# Patient Record
Sex: Male | Born: 1970 | Race: Black or African American | Hispanic: No | Marital: Single | Smoking: Current every day smoker
Health system: Southern US, Community
[De-identification: ages and names within clinical notes are randomized; demographics above are authoritative.]

## PROBLEM LIST (undated history)

## (undated) DIAGNOSIS — D649 Anemia, unspecified: Secondary | ICD-10-CM

## (undated) DIAGNOSIS — Z720 Tobacco use: Secondary | ICD-10-CM

## (undated) DIAGNOSIS — I10 Essential (primary) hypertension: Secondary | ICD-10-CM

## (undated) DIAGNOSIS — E7800 Pure hypercholesterolemia, unspecified: Secondary | ICD-10-CM

## (undated) HISTORY — DX: Pure hypercholesterolemia, unspecified: E78.00

## (undated) HISTORY — DX: Tobacco use: Z72.0

## (undated) HISTORY — DX: Anemia, unspecified: D64.9

## (undated) HISTORY — PX: NO PAST SURGERIES: SHX2092

## (undated) HISTORY — DX: Essential (primary) hypertension: I10

---

## 2000-07-13 ENCOUNTER — Emergency Department (HOSPITAL_COMMUNITY): Admission: EM | Admit: 2000-07-13 | Discharge: 2000-07-13 | Payer: Self-pay | Admitting: Emergency Medicine

## 2001-01-25 ENCOUNTER — Emergency Department (HOSPITAL_COMMUNITY): Admission: EM | Admit: 2001-01-25 | Discharge: 2001-01-25 | Payer: Self-pay | Admitting: Emergency Medicine

## 2005-10-14 ENCOUNTER — Emergency Department (HOSPITAL_COMMUNITY): Admission: EM | Admit: 2005-10-14 | Discharge: 2005-10-14 | Payer: Self-pay | Admitting: Emergency Medicine

## 2006-05-25 ENCOUNTER — Emergency Department (HOSPITAL_COMMUNITY): Admission: EM | Admit: 2006-05-25 | Discharge: 2006-05-25 | Payer: Self-pay | Admitting: Emergency Medicine

## 2006-07-11 ENCOUNTER — Emergency Department (HOSPITAL_COMMUNITY): Admission: EM | Admit: 2006-07-11 | Discharge: 2006-07-11 | Payer: Self-pay | Admitting: Emergency Medicine

## 2010-04-28 ENCOUNTER — Emergency Department (HOSPITAL_COMMUNITY): Admission: EM | Admit: 2010-04-28 | Discharge: 2010-04-29 | Payer: Self-pay | Admitting: Emergency Medicine

## 2010-05-07 ENCOUNTER — Emergency Department (HOSPITAL_COMMUNITY): Admission: EM | Admit: 2010-05-07 | Discharge: 2010-05-08 | Payer: Self-pay | Admitting: Emergency Medicine

## 2010-05-08 ENCOUNTER — Encounter (INDEPENDENT_AMBULATORY_CARE_PROVIDER_SITE_OTHER): Payer: Self-pay | Admitting: *Deleted

## 2010-05-13 ENCOUNTER — Emergency Department (HOSPITAL_COMMUNITY): Admission: EM | Admit: 2010-05-13 | Discharge: 2010-05-13 | Payer: Self-pay | Admitting: Emergency Medicine

## 2010-06-04 ENCOUNTER — Emergency Department (HOSPITAL_COMMUNITY): Admission: EM | Admit: 2010-06-04 | Discharge: 2010-06-05 | Payer: Self-pay | Admitting: Emergency Medicine

## 2010-06-07 ENCOUNTER — Encounter (INDEPENDENT_AMBULATORY_CARE_PROVIDER_SITE_OTHER): Payer: Self-pay | Admitting: *Deleted

## 2011-03-12 ENCOUNTER — Other Ambulatory Visit: Payer: Self-pay | Admitting: Internal Medicine

## 2011-03-12 ENCOUNTER — Encounter: Payer: Self-pay | Admitting: Internal Medicine

## 2011-03-12 ENCOUNTER — Ambulatory Visit (INDEPENDENT_AMBULATORY_CARE_PROVIDER_SITE_OTHER): Payer: Managed Care, Other (non HMO) | Admitting: Internal Medicine

## 2011-03-12 ENCOUNTER — Encounter (INDEPENDENT_AMBULATORY_CARE_PROVIDER_SITE_OTHER): Payer: Self-pay | Admitting: *Deleted

## 2011-03-12 ENCOUNTER — Other Ambulatory Visit: Payer: Managed Care, Other (non HMO)

## 2011-03-12 DIAGNOSIS — Z0000 Encounter for general adult medical examination without abnormal findings: Secondary | ICD-10-CM

## 2011-03-12 DIAGNOSIS — D649 Anemia, unspecified: Secondary | ICD-10-CM | POA: Insufficient documentation

## 2011-03-12 DIAGNOSIS — G4730 Sleep apnea, unspecified: Secondary | ICD-10-CM

## 2011-03-12 DIAGNOSIS — K921 Melena: Secondary | ICD-10-CM

## 2011-03-12 DIAGNOSIS — E785 Hyperlipidemia, unspecified: Secondary | ICD-10-CM

## 2011-03-12 DIAGNOSIS — F17200 Nicotine dependence, unspecified, uncomplicated: Secondary | ICD-10-CM | POA: Insufficient documentation

## 2011-03-12 LAB — URINALYSIS, ROUTINE W REFLEX MICROSCOPIC
Bilirubin Urine: NEGATIVE
Ketones, ur: NEGATIVE
Specific Gravity, Urine: >=1.03 (ref 1.000–1.030)

## 2011-03-12 LAB — LIPID PANEL
Cholesterol: 227 mg/dL — ABNORMAL HIGH (ref 0–200)
HDL: 66.2 mg/dL (ref >39.00)
Total CHOL/HDL Ratio: 3
Triglycerides: 225 mg/dL — ABNORMAL HIGH (ref 0.0–149.0)

## 2011-03-12 LAB — CBC WITH DIFFERENTIAL/PLATELET
Eosinophils Relative: 1.9 % (ref 0.0–5.0)
HCT: 37.2 % — ABNORMAL LOW (ref 39.0–52.0)
Hemoglobin: 12.6 g/dL — ABNORMAL LOW (ref 13.0–17.0)
Lymphocytes Relative: 30.2 % (ref 12.0–46.0)
Neutro Abs: 4.9 10*3/uL (ref 1.4–7.7)
Platelets: 234 10*3/uL (ref 150.0–400.0)

## 2011-03-12 LAB — IBC PANEL: Saturation Ratios: 11.3 % — ABNORMAL LOW (ref 20.0–50.0)

## 2011-03-12 LAB — PSA: PSA: 0.94 ng/mL (ref 0.10–4.00)

## 2011-03-12 LAB — BASIC METABOLIC PANEL
Calcium: 9.8 mg/dL (ref 8.4–10.5)
Chloride: 101 mEq/L (ref 96–112)
Glucose, Bld: 101 mg/dL — ABNORMAL HIGH (ref 70–99)
Potassium: 4 mEq/L (ref 3.5–5.1)
Sodium: 136 mEq/L (ref 135–145)

## 2011-03-12 LAB — HEPATIC FUNCTION PANEL
AST: 43 U/L — ABNORMAL HIGH (ref 0–37)
Bilirubin, Direct: 0.1 mg/dL (ref 0.0–0.3)
Total Bilirubin: 0.3 mg/dL (ref 0.3–1.2)
Total Protein: 7.7 g/dL (ref 6.0–8.3)

## 2011-03-12 LAB — LDL CHOLESTEROL, DIRECT: Direct LDL: 91 mg/dL

## 2011-03-12 LAB — TSH: TSH: 0.69 u[IU]/mL (ref 0.35–5.50)

## 2011-03-18 LAB — CBC
HCT: 38.8 % — ABNORMAL LOW (ref 39.0–52.0)
Platelets: 265 10*3/uL (ref 150–400)
RBC: 4.53 MIL/uL (ref 4.22–5.81)

## 2011-03-18 NOTE — Letter (Signed)
Summary: Results Follow-up Letter  [facility]  [address] [place], [place] [address]   Phone: [phone]  Fax: [phone]    03/12/2011  [address] [name], [place]  [address]  [place]  Dear Mr. Darley,   The following are the results of your recent test(s):  Test     Result     B12/iron     normal Thyroid     normal CBC       mild anemia Liver       one, slight enzyme elevation Kidney     normal Urine       some whote blood cells - ? prostate infection Prostate cancer   normal _________________________________________________________  Please call for an appointment in 1-2 weeks _________________________________________________________ _________________________________________________________ _________________________________________________________  Sincerely,  Sanda Linger MD [facility]

## 2011-03-18 NOTE — Letter (Signed)
Summary: New Patient letter  [facility]  [address] [place], [place] [address]   Phone: [phone]  Fax: [phone]       03/12/2011 MRN: [id number]  [place] [address] [place], [place]  [address]  [place]  Dear Mr. Stegenga,  Welcome to the Gastroenterology Division at [facility].    You are scheduled to see Dr.  Russella Dar on 04-22-11 at 3:30P.M. on the 3rd floor at [facility], [address]  We ask that you try to arrive at our office 15 minutes prior to your appointment time to allow for check-in.  We would like you to complete the enclosed self-administered evaluation form prior to your visit and bring it with you on the day of your appointment.  We will review it with you.  Also, please bring a complete list of all your medications or, if you prefer, bring the medication bottles and we will list them.  Please bring your insurance card so that we may make a copy of it.  If your insurance requires a referral to see a specialist, please bring your referral form from your primary care physician.  Co-payments are due at the time of your visit and may be paid by cash, check or credit card.     Your office visit will consist of a consult with your physician (includes a physical exam), any laboratory testing he/she may order, scheduling of any necessary diagnostic testing (e.g. x-ray, ultrasound, CT-scan), and scheduling of a procedure (e.g. Endoscopy, Colonoscopy) if required.  Please allow enough time on your schedule to allow for any/all of these possibilities.    If you cannot keep your appointment, please call [phone] to cancel or reschedule prior to your appointment date.  This allows [place] the opportunity to schedule an appointment for another patient in need of care.  If you do not cancel or reschedule by 5 p.m. the business day prior to your appointment date, you will be charged a $50.00 late cancellation/no-show fee.    Thank you  for choosing [facility] for your medical needs.  We appreciate the opportunity to care for you.  Please visit [place] at our website  to learn more about our practice.                     Sincerely,                                                             The Gastroenterology Division

## 2011-03-18 NOTE — Assessment & Plan Note (Signed)
Summary: NEW PT / [place]   Vital Signs:  Patient profile:   40 year old male Height:      71 inches Weight:      212 pounds BMI:     29.67 O2 Sat:      98 % on Room air Temp:     98.3 degrees F oral Pulse rate:   76 / minute Pulse rhythm:   regular Resp:     16 per minute BP sitting:   120 / 86  (left arm) Cuff size:   large  Vitals Entered By: Rock Nephew CMA (March 12, 2011 9:16 AM)  Nutrition Counseling: Patient's BMI is greater than 25 and therefore counseled on weight management options.  O2 Flow:  Room air  Primary Care Provider:  Yetta Barre   History of Present Illness: New to me he comes in for a complete physical today. He thinks he has sleep apnea.  He had an episode of blood in his stool in May 2011 and a CBC showed mild anemia, he was referred for a colonoscopy but he tells me that he did not get it done but he wants a new GI referral at this time.  Preventive Screening-Counseling & Management  Alcohol-Tobacco     Alcohol drinks/day: 1     Alcohol type: all     >5/day in last 3 mos: no     Alcohol Counseling: not indicated; use of alcohol is not excessive or problematic     Feels need to cut down: no     Feels annoyed by complaints: no     Feels guilty re: drinking: no     Needs 'eye opener' in am: no     Smoking Status: current     Smoking Cessation Counseling: yes     Packs/Day: 0.5     Year Started: 1987     Pack years: 20     Tobacco Counseling: to quit use of tobacco products  Caffeine-Diet-Exercise     Does Patient Exercise: yes  Hep-HIV-STD-Contraception     Hepatitis Risk: no risk noted     HIV Risk: no risk noted     STD Risk: no risk noted     Dental Visit-last 6 months yes     Dental Care Counseling: to seek dental care; no dental care within six months     TSE monthly: yes     Testicular SE Education/Counseling to perform regular STE     Sun Exposure-Excessive: no  Safety-Violence-Falls     Seat Belt Use: yes     Helmet Use:  n/a     Firearms in the Home: no firearms in the home     Smoke Detectors: yes     Violence in the Home: no risk noted      Sexual History:  currently monogamous.        Drug Use:  no.        Blood Transfusions:  no.    Current Medications (verified): 1)  None  Allergies (verified): No Known Drug Allergies  Past History:  Past Medical History: Anemia-NOS  Past Surgical History: Denies surgical history  Family History: Family History Hypertension  Social History: Occupation: Holiday representative for [employer] Single Current Smoker Alcohol use-yes Drug use-no Regular exercise-yes Smoking Status:  current Drug Use:  no Does Patient Exercise:  yes Packs/Day:  0.5 Hepatitis Risk:  no risk noted HIV Risk:  no risk noted STD Risk:  no risk noted Dental Care w/in 6  mos.:  yes Sun Exposure-Excessive:  no Seat Belt Use:  yes Sexual History:  currently monogamous Blood Transfusions:  no  Review of Systems       The patient complains of weight gain.  The patient denies anorexia, fever, weight loss, chest pain, syncope, dyspnea on exertion, peripheral edema, prolonged cough, headaches, hemoptysis, abdominal pain, melena, hematochezia, severe indigestion/heartburn, hematuria, incontinence, muscle weakness, suspicious skin lesions, transient blindness, difficulty walking, depression, enlarged lymph nodes, angioedema, and testicular masses.   Resp:  Complains of excessive snoring, hypersomnolence, and morning headaches; denies chest discomfort, chest pain with inspiration, cough, coughing up blood, pleuritic, shortness of breath, sputum productive, and wheezing. GI:  Denies abdominal pain, bloody stools, change in bowel habits, constipation, dark tarry stools, diarrhea, excessive appetite, gas, hemorrhoids, indigestion, loss of appetite, nausea, vomiting, vomiting blood, and yellowish skin color. Heme:  Denies abnormal bruising, bleeding, enlarge lymph nodes, fevers, pallor, and skin  discoloration.  Physical Exam  General:  alert, well-developed, well-nourished, well-hydrated, appropriate dress, normal appearance, healthy-appearing, cooperative to examination, and good hygiene.   Head:  normocephalic, atraumatic, no abnormalities observed, and no abnormalities palpated.   Eyes:  vision grossly intact, pupils equal, pupils round, and pupils reactive to light.   Ears:  R ear normal and L ear normal.   Mouth:  Oral mucosa and oropharynx without lesions or exudates.  Teeth in good repair. Neck:  No deformities, masses, or tenderness noted. Breasts:  No masses or gynecomastia noted Lungs:  Normal respiratory effort, chest expands symmetrically. Lungs are clear to auscultation, no crackles or wheezes. Heart:  Normal rate and regular rhythm. S1 and S2 normal without gallop, murmur, click, rub or other extra sounds. Abdomen:  soft, non-tender, normal bowel sounds, no distention, no masses, no guarding, no rigidity, no rebound tenderness, no abdominal hernia, no inguinal hernia, no hepatomegaly, and no splenomegaly.   Rectal:  No external abnormalities noted. Normal sphincter tone. No rectal masses or tenderness. Genitalia:  circumcised, no hydrocele, no varicocele, no scrotal masses, no testicular masses or atrophy, no cutaneous lesions, and no urethral discharge.   Prostate:  no nodules, no asymmetry, no induration, and 1+ enlarged.   Msk:  No deformity or scoliosis noted of thoracic or lumbar spine.   Pulses:  R and L carotid,radial,femoral,dorsalis pedis and posterior tibial pulses are full and equal bilaterally Extremities:  No clubbing, cyanosis, edema, or deformity noted with normal full range of motion of all joints.   Neurologic:  No cranial nerve deficits noted. Station and gait are normal. Plantar reflexes are down-going bilaterally. DTRs are symmetrical throughout. Sensory, motor and coordinative functions appear intact. Skin:  Intact without suspicious lesions or  rashes Cervical Nodes:  No lymphadenopathy noted Axillary Nodes:  No palpable lymphadenopathy Inguinal Nodes:  No significant adenopathy Psych:  Cognition and judgment appear intact. Alert and cooperative with normal attention span and concentration. No apparent delusions, illusions, hallucinations Additional Exam:  EKG shows some early repol but nothing abnormal   Impression & Recommendations:  Problem # 1:  ROUTINE GENERAL MEDICAL EXAM@HEALTH  CARE FACL (ICD-V70.0) Assessment New  Orders: TLB-B12 + Folate Pnl (16[address]_60454-U98/JXB) TLB-IBC Pnl (Iron/FE;Transferrin) (83550-IBC) TLB-Lipid Panel (80061-LIPID) TLB-BMP (Basic Metabolic Panel-BMET) (80048-METABOL) TLB-CBC Platelet - w/Differential (85025-CBCD) TLB-Hepatic/Liver Function Pnl (80076-HEPATIC) TLB-TSH (Thyroid Stimulating Hormone) (84443-TSH) TLB-PSA (Prostate Specific Antigen) (84153-PSA) TLB-Udip w/ Micro (81001-URINE) EKG w/ Interpretation (93000) Hemoccult Guaiac-1 spec.(in office) (82270)  Discussed using sunscreen, use of alcohol, drug use, self testicular exam, routine dental care, routine eye care, routine physical exam,  seat belts, multiple vitamins, osteoporosis prevention, adequate calcium intake in diet, and recommendations for immunizations.  Discussed exercise and checking cholesterol.  Discussed gun safety, safe sex, and contraception. Also recommend checking PSA.  Problem # 2:  BLOOD IN STOOL (ICD-578.1) Assessment: Improved  Orders: Gastroenterology Referral (GI) Hemoccult Guaiac-1 spec.(in office) (82270)  Problem # 3:  ANEMIA-NOS (ICD-285.9) Assessment: Unchanged  Orders: TLB-B12 + Folate Pnl (10272_53664-Q03/KVQ) TLB-IBC Pnl (Iron/FE;Transferrin) (83550-IBC) TLB-Lipid Panel (80061-LIPID) TLB-BMP (Basic Metabolic Panel-BMET) (80048-METABOL) TLB-CBC Platelet - w/Differential (85025-CBCD) TLB-Hepatic/Liver Function Pnl (80076-HEPATIC) TLB-TSH (Thyroid Stimulating Hormone) (84443-TSH) TLB-PSA  (Prostate Specific Antigen) (84153-PSA) TLB-Udip w/ Micro (81001-URINE) Hemoccult Guaiac-1 spec.(in office) (82270)  Problem # 4:  SLEEP APNEA (ICD-780.57) Assessment: New  Orders: Sleep Disorder Referral (Sleep Disorder) TLB-B12 + Folate Pnl (25956_38756-E33/IRJ) TLB-IBC Pnl (Iron/FE;Transferrin) (83550-IBC) TLB-Lipid Panel (80061-LIPID) TLB-BMP (Basic Metabolic Panel-BMET) (80048-METABOL) TLB-CBC Platelet - w/Differential (85025-CBCD) TLB-Hepatic/Liver Function Pnl (80076-HEPATIC) TLB-TSH (Thyroid Stimulating Hormone) (84443-TSH) TLB-PSA (Prostate Specific Antigen) (84153-PSA) TLB-Udip w/ Micro (81001-URINE) Tobacco use cessation intermediate 3-10 minutes (18841)  Colorectal Screening:  Current Recommendations:    Hemoccult: NEG X 1 today    Colonoscopy recommended: scheduled with G.I.  PSA Screening:    Reviewed PSA screening recommendations: PSA ordered  Patient Instructions: 1)  Please schedule a follow-up appointment in 2 weeks. 2)  Tobacco is very bad for your health and your loved ones! You Should stop smoking!. 3)  Stop Smoking Tips: Choose a Quit date. Cut down before the Quit date. decide what you will do as a substitute when you feel the urge to smoke(gum,toothpick,exercise). 4)  Schedule a colonoscopy/sigmoidoscopy to help detect colon cancer. 5)  If you could be exposed to sexually transmitted diseases, you should use a condom. 6)  Take an Aspirin every day.   Orders Added: 1)  Sleep Disorder Referral [Sleep Disorder] 2)  TLB-B12 + Folate Pnl [82746_82607-B12/FOL] 3)  TLB-IBC Pnl (Iron/FE;Transferrin) [83550-IBC] 4)  TLB-Lipid Panel [80061-LIPID] 5)  TLB-BMP (Basic Metabolic Panel-BMET) [80048-METABOL] 6)  TLB-CBC Platelet - w/Differential [85025-CBCD] 7)  TLB-Hepatic/Liver Function Pnl [80076-HEPATIC] 8)  TLB-TSH (Thyroid Stimulating Hormone) [84443-TSH] 9)  TLB-PSA (Prostate Specific Antigen) [84153-PSA] 10)  TLB-Udip w/ Micro [81001-URINE] 11)   Gastroenterology Referral [GI] 12)  EKG w/ Interpretation [93000] 13)  Tobacco use cessation intermediate 3-10 minutes [99406] 14)  Hemoccult Guaiac-1 spec.(in office) [82270] 15)  New Patient Level III [66063] 16)  New Patient 40-64 years [phone]

## 2011-03-18 NOTE — Letter (Signed)
Summary: Lipid Letter  [facility]  [address] [place], [place] [address]   Phone: [phone]  Fax: [phone]    03/12/2011  Campbell Riches [address] [place], [place]  [address]  Dear Molly Maduro:  We have carefully reviewed your last lipid profile from  and the results are noted below with a summary of recommendations for lipid management.    Cholesterol:       227     Goal: <200   HDL "good" Cholesterol:   25.42     Goal: >40   LDL "bad" Cholesterol:   91     Goal: <130   Triglycerides:       225.0     Goal: <150 too high        TLC Diet (Therapeutic Lifestyle Change): Saturated Fats & Transfatty acids should be kept < 7% of total calories ***Reduce Saturated Fats Polyunstaurated Fat can be up to 10% of total calories Monounsaturated Fat Fat can be up to 20% of total calories Total Fat should be no greater than 25-35% of total calories Carbohydrates should be 50-60% of total calories Protein should be approximately 15% of total calories Fiber should be at least 20-30 grams a day ***Increased fiber may help lower LDL Total Cholesterol should be < 200mg /day Consider adding plant stanol/sterols to diet (example: Benacol spread) ***A higher intake of unsaturated fat may reduce Triglycerides and Increase HDL    Adjunctive Measures (may lower LIPIDS and reduce risk of Heart Attack) include: Aerobic Exercise (20-30 minutes 3-4 times a week) Limit Alcohol Consumption Weight Reduction Aspirin 75-81 mg a day by mouth (if not allergic or contraindicated) Dietary Fiber 20-30 grams a day by mouth     Current Medications:  None If you have any questions, please call. We appreciate being able to work with you.   Sincerely,    [facility]

## 2011-03-26 ENCOUNTER — Encounter: Payer: Self-pay | Admitting: Internal Medicine

## 2011-03-26 ENCOUNTER — Ambulatory Visit (INDEPENDENT_AMBULATORY_CARE_PROVIDER_SITE_OTHER): Payer: Managed Care, Other (non HMO) | Admitting: Internal Medicine

## 2011-03-26 VITALS — BP 126/82 | HR 77 | Temp 98.6°F | Ht 71.0 in | Wt 210.0 lb

## 2011-03-26 DIAGNOSIS — Z720 Tobacco use: Secondary | ICD-10-CM

## 2011-03-26 DIAGNOSIS — E781 Pure hyperglyceridemia: Secondary | ICD-10-CM

## 2011-03-26 DIAGNOSIS — K921 Melena: Secondary | ICD-10-CM

## 2011-03-26 DIAGNOSIS — F17200 Nicotine dependence, unspecified, uncomplicated: Secondary | ICD-10-CM

## 2011-03-26 DIAGNOSIS — G4730 Sleep apnea, unspecified: Secondary | ICD-10-CM

## 2011-03-26 MED ORDER — FENOFIBRATE 150 MG PO CAPS: 1.0000 | ORAL_CAPSULE | Freq: Every day | ORAL | Status: DC

## 2011-03-26 NOTE — Assessment & Plan Note (Signed)
He tells me that his colonoscopy is scheduled very soon

## 2011-03-26 NOTE — Patient Instructions (Signed)
What is this medicine? FENOFIBRATE (fen oh FYE brate) can help lower blood fats and cholesterol for people who are at risk of getting inflammation of the pancreas (pancreatitis) from having very high amounts of fats in their blood. This medicine is only for patients whose blood fats are not controlled by diet. This medicine may be used for other purposes; ask your health care provider or pharmacist if you have questions.   What should I tell my health care provider before I take this medicine? They need to know if you have any of these conditions: -gallbladder disease -heart disease -kidney disease -liver disease -an unusual or allergic reaction to fenofibrate, gemfibrozil, other medicines, foods, dyes, or preservatives -pregnant or trying to get pregnant -breast-feeding   How should I use this medicine? Take this medicine by mouth with a glass of water. Follow the directions on the prescription label. Do not take chipped or broken tablets. Take your medicine at regular intervals. Do not take it more often than directed. Do not stop taking except on your doctor's advice.   Talk to your pediatrician regarding the use of this medicine in children. Special care may be needed.   Overdosage: If you think you have taken too much of this medicine contact a poison control center or emergency room at once. NOTE: This medicine is only for you. Do not share this medicine with others.   What if I miss a dose? If you miss a dose, take it as soon as you can. If it is almost time for your next dose, take only that dose. Do not take double or extra doses.   What may interact with this medicine? Do not take this medicine with any of the following medications: -ezetimibe -statin-type cholesterol lowering drugs like atorvastatin, cerivastatin, fluvastatin, lovastatin, pravastatin, or simvastatin -red yeast rice   This medicine may also interact with the following medications: -cholestyramine or  colestipol -cyclosporine -warfarin   This list may not describe all possible interactions. Give your health care provider a list of all the medicines, herbs, non-prescription drugs, or dietary supplements you use. Also tell them if you smoke, drink alcohol, or use illegal drugs. Some items may interact with your medicine.   What should I watch for while using this medicine? Visit your doctor or health care professional for regular checks on your progress. Your blood fats and other tests will be measured from time to time. Do not stop taking this medicine except on the advice of your doctor or health care professional.   This medicine is only part of a total cholesterol-lowering program. Your health care professional or dietician can suggest a low-cholesterol and low-fat diet that will reduce your risk of getting heart and blood vessel disease. Avoid alcohol and smoking, and keep a proper exercise schedule.   If you are diabetic, close regulation and monitoring of your blood sugars can help your blood fat levels. This medicine may change the way your diabetic medication works, and sometimes will require that your dosages be adjusted. Check with your doctor or health care professional.   Bonita Quin may get drowsy or dizzy. Do not drive, use machinery, or do anything that needs mental alertness until you know how this drug affects you. Do not stand or sit up quickly, especially if you are an older patient. This reduces the risk of dizzy or fainting spells.   This medicine can make you more sensitive to the sun. Keep out of the sun. If you cannot avoid being  in the sun, wear protective clothing and use sunscreen. Do not use sun lamps or tanning beds/booths.   What side effects may I notice from receiving this medicine? Side effects that you should report to your doctor or health care professional as soon as possible: -allergic reactions like skin rash, itching or hives, swelling of the face, lips, or  tongue -dark urine -lower back or side pain -muscle pain, tenderness, or weakness -skin-bruising -stomach pain -trouble passing urine or change in the amount of urine -unusually weak or tired -yellowing of the eyes or skin   Side effects that usually do not require medical attention (report to your doctor or health care professional if they continue or are bothersome): -constipation -headache -nausea   This list may not describe all possible side effects. Call your doctor for medical advice about side effects. You may report side effects to FDA at 1-800-FDA-1088.   Where should I keep my medicine? Keep out of the reach of children.   Store the tablets in the original container at room temperature between 15 and 30 degrees C (59 and 86 degrees F). Keep container tightly closed. Throw away any unused medicine after the expiration date.   NOTE:This sheet is a summary. It may not cover all possible information. If you have questions about this medicine, talk to your doctor, pharmacist, or health care provider.      2011, [employer].  Hypertriglyceridemia  Diet for High blood levels of Triglycerides Most fats in food are triglycerides. Triglycerides in your blood are stored as fat in your body. High levels of triglycerides in your blood may put you at a greater risk for heart disease and stroke.  Normal triglyceride levels are less than 150 mg/dL. Borderline high levels are 150-199 mg/dl. High levels are 200 - 499 mg/dL, and very high triglyceride levels are greater than 500 mg/dL. The decision to treat high triglycerides is generally based on the level. For people with borderline or high triglyceride levels, treatment includes weight loss and exercise. Drugs are recommended for people with very high triglyceride levels. Many people who need treatment for high triglyceride levels have metabolic syndrome. This syndrome is a collection of disorders that often include: insulin  resistance, high blood pressure, blood clotting problems, high cholesterol and triglycerides. TESTING PROCEDURE FOR TRIGLYCERIDES  You should not eat 4 hours before getting your triglycerides measured. The normal range of triglycerides is between 10 and 250 milligrams per deciliter (mg/dl). Some people may have extreme levels (1000 or above), but your triglyceride level may be too high if it is above 150 mg/dl, depending on what other risk factors you have for heart disease.   People with high blood triglycerides may also have high blood cholesterol levels. If you have high blood cholesterol as well as high blood triglycerides, your risk for heart disease is probably greater than if you only had high triglycerides. High blood cholesterol is one of the main risk factors for heart disease.  CHANGING YOUR DIET  Your weight can affect your blood triglyceride level. If you are more than 20% above your ideal body weight, you may be able to lower your blood triglycerides by losing weight. Eating less and exercising regularly is the best way to combat this. Fat provides more calories than any other food. The best way to lose weight is to eat less fat. Only 30% of your total calories should come from fat. Less than 7% of your diet should come from saturated fat. A diet  low in fat and saturated fat is the same as a diet to decrease blood cholesterol. By eating a diet lower in fat, you may lose weight, lower your blood cholesterol, and lower your blood triglyceride level.  Eating a diet low in fat, especially saturated fat, may also help you lower your blood triglyceride level. Ask your dietitian to help you figure how much fat you can eat based on the number of calories your caregiver has prescribed for you.  Exercise, in addition to helping with weight loss may also help lower triglyceride levels.   Alcohol can increase blood triglycerides. You may need to stop drinking alcoholic beverages.   Too much  carbohydrate in your diet may also increase your blood triglycerides. Some complex carbohydrates are necessary in your diet. These may include bread, rice, potatoes, other starchy vegetables and cereals.   Reduce "simple" carbohydrates. These may include pure sugars, candy, honey, and jelly without losing other nutrients. If you have the kind of high blood triglycerides that is affected by the amount of carbohydrates in your diet, you will need to eat less sugar and less high-sugar foods. Your caregiver can help you with this.   Adding 2-4 grams of fish oil (EPA+ DHA) may also help lower triglycerides. Speak with your caregiver before adding any supplements to your regimen.  Following the Diet  Maintain your ideal weight. Your caregivers can help you with a diet. Generally, eating less food and getting more exercise will help you lose weight. Joining a weight control group may also help. Ask your caregivers for a good weight control group in your area.  Eat low-fat foods instead of high-fat foods. This can help you lose weight too.  These foods are lower in fat. Eat MORE of these:   Dried beans, peas, and lentils.   Egg whites.   Low-fat cottage cheese.   Fish.   Lean cuts of meat, such as round, sirloin, rump, and flank (cut extra fat off meat you fix).   Whole grain breads, cereals and pasta.   Skim and nonfat dry milk.   Low-fat yogurt.   Poultry without the skin.   Cheese made with skim or part-skim milk, such as mozzarella, parmesan, farmers', ricotta, or pot cheese.   These are higher fat foods. Eat LESS of these:   Whole milk and foods made from whole milk, such as American, blue, cheddar, monterey jack, and swiss cheese   High-fat meats, such as luncheon meats, sausages, knockwurst, bratwurst, hot dogs, ribs, corned beef, ground pork, and regular ground beef.   Fried foods.  Limit saturated fats in your diet. Substituting unsaturated fat for saturated fat may decrease your  blood triglyceride level. You will need to read package labels to know which products contain saturated fats.  These foods are high in saturated fat. Eat LESS of these:   Fried pork skins.  Whole milk.   Skin and fat from poultry.   Palm oil.   Butter.   Shortening.   Cream cheese.   Tomasa Blase.   Margarines and baked goods made from listed oils.   Vegetable shortenings.   Chitterlings.  Fat from meats.   Coconut oil.   Palm kernel oil.   Lard.   Cream.   Sour cream.   Fatback.   Coffee whiteners and non-dairy creamers made with these oils.   Cheese made from whole milk.   Use unsaturated fats (both polyunsaturated and monounsaturated) moderately. Remember, even though unsaturated fats are better than saturated  fats; you still want a diet low in total fat.  These foods are high in unsaturated fat:   Canola oil.  Sunflower oil.   Mayonnaise.   Almonds.   Peanuts.   Pine nuts.   Margarines made with these oils.   Safflower oil.  Olive oil.   Avocados.   Cashews.   Peanut butter.   Sunflower seeds.   Soybean oil.  Peanut oil.   Olives.   Pecans.   Walnuts.   Pumpkin seeds.   Avoid sugar and other high-sugar foods. This will decrease carbohydrates without decreasing other nutrients. Sugar in your food goes rapidly to your blood. When there is excess sugar in your blood, your liver may use it to make more triglycerides. Sugar also contains calories without other important nutrients.  Eat LESS of these:   Sugar, brown sugar, powdered sugar, jam, jelly, preserves, honey, syrup, molasses, pies, candy, cakes, cookies, frosting, pastries, colas, soft drinks, punches, fruit drinks, and regular gelatin.   Avoid alcohol. Alcohol, even more than sugar, may increase blood triglycerides. In addition, alcohol is high in calories and low in nutrients. Ask for sparkling water, or a diet soft drink instead of an alcoholic beverage.  Suggestions for planning  and preparing meals   Bake, broil, grill or roast meats instead of frying.   Remove fat from meats and skin from poultry before cooking.   Add spices, herbs, lemon juice or vinegar to vegetables instead of salt, rich sauces or gravies.   Use a non-stick skillet without fat or use no-stick sprays.   Cool and refrigerate stews and broth. Then remove the hardened fat floating on the surface before serving.   Refrigerate meat drippings and skim off fat to make low-fat gravies.   Serve more fish.   Use less butter, margarine and other high-fat spreads on bread or vegetables.   Use skim or reconstituted non-fat dry milk for cooking.   Cook with low-fat cheeses.   Substitute low-fat yogurt or cottage cheese for all or part of the sour cream in recipes for sauces, dips or congealed salads.   Use half yogurt/half mayonnaise in salad recipes.   Substitute evaporated skim milk for cream. Evaporated skim milk or reconstituted non-fat dry milk can be whipped and substituted for whipped cream in certain recipes.   Choose fresh fruits for dessert instead of high-fat foods such as pies or cakes. Fruits are naturally low in fat.  When Dining Out   Order low-fat appetizers such as fruit or vegetable juice, pasta with vegetables or tomato sauce.   Select clear, rather than cream soups.   Ask that dressings and gravies be served on the side. Then use less of them.   Order foods that are baked, broiled, poached, steamed, stir-fried, or roasted.   Ask for margarine instead of butter, and use only a small amount.   Drink sparkling water, unsweetened tea or coffee, or diet soft drinks instead of alcohol or other sweet beverages.  QUESTIONS AND ANSWERS ABOUT OTHER FATS IN THE BLOOD:  SATURATED FAT, TRANS FAT, AND CHOLESTEROL What is trans fat? Trans fat is a type of fat that is formed when vegetable oil is hardened through a process called hydrogenation. This process helps makes foods more solid,  gives them shape, and prolongs their shelf life. Trans fats are also called hydrogenated or partially hydrogenated oils.  What do saturated fat, trans fat, and cholesterol in foods have to do with heart disease? Saturated fat, trans fat, and  cholesterol in the diet all raise the level of LDL "bad" cholesterol in the blood. The higher the LDL cholesterol, the greater the risk for coronary heart disease (CHD). Saturated fat and trans fat raise LDL similarly.  What foods contain saturated fat, trans fat, and cholesterol? High amounts of saturated fat are found in animal products, such as fatty cuts of meat, chicken skin, and full-fat dairy products like butter, whole milk, cream, and cheese, and in tropical vegetable oils such as palm, palm kernel, and coconut oil. Trans fat is found in some of the same foods as saturated fat, such as vegetable shortening, some margarines (especially hard or stick margarine), crackers, cookies, baked goods, fried foods, salad dressings, and other processed foods made with partially hydrogenated vegetable oils. Small amounts of trans fat also occur naturally in some animal products, such as milk products, beef, and lamb. Foods high in cholesterol include liver, other organ meats, egg yolks, shrimp, and full-fat dairy products. How can I use the new food label to make heart-healthy food choices? Check the Nutrition Facts panel of the food label. Choose foods lower in saturated fat, trans fat, and cholesterol. For saturated fat and cholesterol, you can also use the Percent Daily Value (%DV): 5% DV or less is low, and 20% DV or more is high. (There is no %DV for trans fat.) Use the Nutrition Facts panel to choose foods low in saturated fat and cholesterol, and if the trans fat is not listed, read the ingredients and limit products that list shortening or hydrogenated or partially hydrogenated vegetable oil, which tend to be high in trans fat. POINTS TO REMEMBER: YOU NEED A LITTLE  TLC (THERAPEUTIC LIFESTYLE CHANGES)  Discuss your risk for heart disease with your caregivers, and take steps to reduce risk factors.   Change your diet. Choose foods that are low in saturated fat, trans fat, and cholesterol.   Add exercise to your daily routine if it is not already being done. Participate in physical activity of moderate intensity, like brisk walking, for at least 30 minutes on most, and preferably all days of the week. No time? Break the 30 minutes into three, 10-minute segments during the day.   Stop smoking. If you do smoke, contact your caregiver to discuss ways in which they can help you quit.   Do not use street drugs.   Maintain a normal weight.   Maintain a healthy blood pressure.   Keep up with your blood work for checking the fats in your blood as directed by your caregiver.  Document Released: 10/02/2004 Document Re-Released: 06/04/2010 [facility] Patient Information 2011 [place], [place].

## 2011-03-26 NOTE — Assessment & Plan Note (Signed)
He tells me that he is doing a sleep study soon

## 2011-03-26 NOTE — Assessment & Plan Note (Signed)
I advised him of the need to quit smoking but he is not interested at this time, I will continue to inform him of the harms of tobacco abuse

## 2011-03-26 NOTE — Progress Notes (Signed)
Subjective:    Tony Robertson is a 40 y.o. male here for follow up of dyslipidemia. The patient does not use medications that may worsen dyslipidemias (corticosteroids, progestins, anabolic steroids, diuretics, beta-blockers, amiodarone, cyclosporine, olanzapine). The patient exercises infrequently. The patient is not known to have coexisting coronary artery disease.   Cardiac Risk Factors Age > 45-male, > 55-male:  YES  +1  Smoking:   YES  +1  Sig. family hx of CHD*:  YES  +1  Hypertension:   NO  Diabetes:   NO  HDL < 35:   NO  HDL > 59:   YES  -1  Total: 2   *- Sig. family h/o CHD per NCEP = MI or sudden death at <55yo in  father or other 1st-degree male relative, or <65yo in mother or  other 1st-degree male relative  The following portions of the patient's history were reviewed and updated as appropriate: allergies, current medications, past family history, past medical history, past social history, past surgical history and problem list.  Review of Systems A comprehensive review of systems was negative.    Objective:    BP 126/82  Pulse 77  Temp(Src) 98.6 F (37 C) (Oral)  Ht 5\' 11"  (1.803 m)  Wt 210 lb (95.255 kg)  BMI 29.29 kg/m2  SpO2 97%  General Appearance:    Alert, cooperative, no distress, appears stated age  Head:    Normocephalic, without obvious abnormality, atraumatic  Eyes:    PERRL, conjunctiva/corneas clear, EOM's intact, fundi    benign, both eyes       Ears:    Normal TM's and external ear canals, both ears  Nose:   Nares normal, septum midline, mucosa normal, no drainage    or sinus tenderness  Throat:   Lips, mucosa, and tongue normal; teeth and gums normal  Neck:   Supple, symmetrical, trachea midline, no adenopathy;       thyroid:  No enlargement/tenderness/nodules; no carotid   bruit or JVD  Back:     Symmetric, no curvature, ROM normal, no CVA tenderness  Lungs:     Clear to auscultation bilaterally, respirations unlabored  Chest wall:     No tenderness or deformity  Heart:    Regular rate and rhythm, S1 and S2 normal, no murmur, rub   or gallop  Abdomen:     Soft, non-tender, bowel sounds active all four quadrants,    no masses, no organomegaly        Extremities:   Extremities normal, atraumatic, no cyanosis or edema  Pulses:   2+ and symmetric all extremities  Skin:   Skin color, texture, turgor normal, no rashes or lesions  Lymph nodes:   Cervical, supraclavicular, and axillary nodes normal  Neurologic:   CNII-XII intact. Normal strength, sensation and reflexes      throughout    Lab Review Lab Results  Component Value Date   CHOL 227* 03/12/2011   HDL 66.20 03/12/2011   LDLDIRECT 91.0 03/12/2011    Lab Results  Component Value Date   WBC 8.8 03/12/2011   HGB 12.6* 03/12/2011   HCT 37.2* 03/12/2011   PLT 234.0 03/12/2011   CHOL 227* 03/12/2011   TRIG 225.0* 03/12/2011   HDL 66.20 03/12/2011   LDLDIRECT 91.0 03/12/2011   ALT 37 03/12/2011   AST 43* 03/12/2011   NA 136 03/12/2011   K 4.0 03/12/2011   CL 101 03/12/2011   CREATININE 1.1 03/12/2011   BUN 16 03/12/2011  CO2 29 03/12/2011   TSH 0.69 03/12/2011   PSA 0.94 03/12/2011     Assessment:    Dyslipidemia as detailed above with 2 CHD risk factors using NCEP scheme above.  Target levels for LDL are: < 130 mg/dl (2 or more risk factors are present)  Explained to the patient the respective contributions of genetics, diet, and exercise to lipid levels and the use of medication in severe cases which do not respond to lifestyle alteration. The patient's interest and motivation in making lifestyle changes seems poor.    Plan:    The following changes are planned for the next 2 months, at which time the patient will return for repeat fasting lipids:  1. Dietary changes: Increase soluble fiber Reduce saturated fat, "trans" monounsaturated fatty acids, and cholesterol Referral to nutritionist 2. Exercise changes:  Advised to engage in aerobics frequently, with pulse  rate range 120 to 140 and frequency goal is 3-4 times a week 3. Other treatment: Smoking cessation (soon) 4. Lipid-lowering medications: lipofen  (Recommended by NCEP after 3-6 mos of dietary therapy & lifestyle modification,  except if CHD is present or LDL well above 190.) 5. Hormone replacement therapy (patient is a postmenopausal  woman): no 6. Screening for secondary causes of dyslipidemias: TSH Blood glucose Alkaline phosphatase to r/o hepatobiliary obstruction Serum creatinine to r/o chronic renal failure 7. Lipid screening for relatives: no 8. Follow up: 2 months.  Note: The majority of the visit was spent in counseling on the pathophysiology and treatment of dyslipidemias. The total face-to-face time was in excess of 15 minutes.

## 2011-04-14 ENCOUNTER — Encounter: Payer: Self-pay | Admitting: Pulmonary Disease

## 2011-04-16 ENCOUNTER — Encounter: Payer: Self-pay | Admitting: Pulmonary Disease

## 2011-04-16 ENCOUNTER — Ambulatory Visit (INDEPENDENT_AMBULATORY_CARE_PROVIDER_SITE_OTHER): Payer: Managed Care, Other (non HMO) | Admitting: Pulmonary Disease

## 2011-04-16 ENCOUNTER — Ambulatory Visit: Payer: Managed Care, Other (non HMO) | Admitting: *Deleted

## 2011-04-16 VITALS — BP 126/82 | HR 93 | Temp 98.1°F | Ht 71.0 in | Wt 205.6 lb

## 2011-04-16 DIAGNOSIS — G4726 Circadian rhythm sleep disorder, shift work type: Secondary | ICD-10-CM | POA: Insufficient documentation

## 2011-04-16 DIAGNOSIS — G4730 Sleep apnea, unspecified: Secondary | ICD-10-CM

## 2011-04-16 MED ORDER — MELATONIN 3 MG PO CAPS: ORAL_CAPSULE | ORAL | Status: DC

## 2011-04-16 NOTE — Progress Notes (Signed)
Subjective:    Patient ID: Tony Robertson, male    DOB: 07/12/1971, 40 y.o.   MRN: [id number]  HPI 40 yo male for sleep evaluation.  He has trouble falling asleep and staying asleep.  He goes to bed at 11pm, and can take up to 1.5 hours to fall asleep.  He will lay in bed watching TV.  He sleeps for 3 hours, then wakes up.  He will again watch TV.  He gets out of bed at 6am.  He feels tired at times in the morning, but denies headache.  He does not use anything to help sleep.  He will occasionally use an energy drink in the day.    He was working third shift until a few months ago.  It has been difficult for him to adjust to his new sleep/wake schedule.  He snores, and his wife sees him stop breathing while asleep.  He wakes up with a choke.  He mumbles and is restless while asleep.  The patient denies sleep talking, bruxism, or nightmares.  There is no history of restless legs.  The patient denies sleep hallucinations, sleep paralysis, or cataplexy.  He drinks 4 beers a day, and 1/2 pint of liquor when with friends.  He continues to smoke.  He denies depression, thyroid disease, or weight change.  Epworth score is 0.   Past Medical History  Diagnosis Date  . Anemia   . Tobacco abuse   . High cholesterol      Family History  Problem Relation Age of Onset  . Hypertension Other   . Hyperlipidemia Father   . Heart disease Father   . Cancer Maternal Aunt   . Cancer Maternal Grandmother   . Rheum arthritis Mother   . Asthma Cousin      History   Social History  . Marital Status: Single    Spouse Name: N/A    Number of Children: N/A  . Years of Education: N/A   Occupational History  . Not on file.   Social History Main Topics  . Smoking status: Current Everyday Smoker -- 0.5 packs/day for 20 years    Types: Cigarettes  . Smokeless tobacco: Not on file  . Alcohol Use: Yes     2-3 beers a day  . Drug Use: No  . Sexually Active: Not on file   Other Topics Concern  .  Not on file   Social History Narrative   Regular Exercise -  YES     No Known Allergies   Outpatient Prescriptions Prior to Visit  Medication Sig Dispense Refill  . Fenofibrate (LIPOFEN) 150 MG CAPS Take 1 capsule (150 mg total) by mouth daily.  60 each  0      Review of Systems  Constitutional: Negative for fever, appetite change and unexpected weight change.  HENT: Negative for ear pain, congestion, sore throat, sneezing, trouble swallowing and dental problem.   Respiratory: Negative for cough, chest tightness and shortness of breath.   Cardiovascular: Negative for chest pain, palpitations and leg swelling.  Musculoskeletal: Negative for joint swelling.  Psychiatric/Behavioral: Negative for dysphoric mood.       Objective:   Physical Exam Filed Vitals:   04/16/11 1412  BP: 126/82  Pulse: 93  Temp: 98.1 F (36.7 C)  TempSrc: Oral  Height: 5\' 11"  (1.803 m)  Weight: 205 lb 9.6 oz (93.26 kg)  SpO2: 97%       General - Obese, healthy, no distress HEENT - PERRLA,  EOMI, no sinus tenderness, MP4, 2+ tonsills, no LAN, no thyromegaly Cardiac - S1S2, no murmur Chest - CTA Abd - soft, nontender, normal bowel sounds Ext - No E/C/C Neuro - Normal strength, A&O x 3, CN intact Skin - mobile nodule in Lt upper chest Psych - normal mood and behavior   Assessment & Plan:   SLEEP APNEA He has sleep disruption, snoring, and witnessed apnea.  I am concerned he could have sleep apnea.  Explained how sleep apnea can affect the patient's health.  Driving precautions and importance of weight loss were discussed.  Will arrange for sleep study to further evaluate.   Shifting sleep-work schedule, affecting sleep He has recent change from 3 rd shift to 1 st shift.    I discussed proper sleep hygiene.  Emphasized importance of maintaining his sleep schedule.  Discussed how alcohol use can affect his sleep.  Advised that he could try using melatonin 3 to 5 mg 30 min before desired  bedtime.    Updated Medication List Outpatient Encounter Prescriptions as of 04/16/2011  Medication Sig Dispense Refill  . Fenofibrate (LIPOFEN) 150 MG CAPS Take 1 capsule (150 mg total) by mouth daily.  60 each  0  . Melatonin 3 MG CAPS One 30 minutes before desired bedtime as needed  30 capsule  0

## 2011-04-16 NOTE — Assessment & Plan Note (Signed)
He has sleep disruption, snoring, and witnessed apnea.  I am concerned he could have sleep apnea.  Explained how sleep apnea can affect the patient's health.  Driving precautions and importance of weight loss were discussed.  Will arrange for sleep study to further evaluate.

## 2011-04-16 NOTE — Assessment & Plan Note (Signed)
He has recent change from 3 rd shift to 1 st shift.    I discussed proper sleep hygiene.  Emphasized importance of maintaining his sleep schedule.  Discussed how alcohol use can affect his sleep.  Advised that he could try using melatonin 3 to 5 mg 30 min before desired bedtime.

## 2011-04-16 NOTE — Patient Instructions (Signed)
Will arrange for sleep study Will call for follow up after sleep test reviewed Can try Melatonin 3 to 5 mg 30 minutes before desired bedtime

## 2011-04-17 ENCOUNTER — Ambulatory Visit (HOSPITAL_BASED_OUTPATIENT_CLINIC_OR_DEPARTMENT_OTHER): Payer: Managed Care, Other (non HMO)

## 2011-04-22 ENCOUNTER — Ambulatory Visit (INDEPENDENT_AMBULATORY_CARE_PROVIDER_SITE_OTHER): Payer: Managed Care, Other (non HMO) | Admitting: Gastroenterology

## 2011-04-22 ENCOUNTER — Encounter: Payer: Self-pay | Admitting: Gastroenterology

## 2011-04-22 VITALS — BP 110/88 | HR 80 | Ht 70.0 in | Wt 211.0 lb

## 2011-04-22 DIAGNOSIS — R198 Other specified symptoms and signs involving the digestive system and abdomen: Secondary | ICD-10-CM

## 2011-04-22 DIAGNOSIS — K921 Melena: Secondary | ICD-10-CM

## 2011-04-22 DIAGNOSIS — D649 Anemia, unspecified: Secondary | ICD-10-CM

## 2011-04-22 DIAGNOSIS — D509 Iron deficiency anemia, unspecified: Secondary | ICD-10-CM

## 2011-04-22 MED ORDER — PEG-KCL-NACL-NASULF-NA ASC-C 100 G PO SOLR: 1.0000 | Freq: Once | ORAL | Status: AC

## 2011-04-22 NOTE — Progress Notes (Signed)
History of Present Illness: This is a 40 year old male who relates a history of hemorrhoidal swelling and itching several years ago that resolved with over-the-counter suppositories. Approximately 4-6 weeks ago he noted the onset of a change in bowel habits with occasional looser stools and occasional constipation. This was associated with bright red rectal bleeding and occasional dark stools without rectal pain or itching. He denies any change in diet or medications. Recent blood work revealed a normocytic anemia with a hemoglobin of 12.6. Iron 52 (42 - 165 ug/dL). Transferrin 328.3 (212.0 - 360.0 mg/dL) and Saturation Ratios 11.3 (L) (20.0 - 50.0 %). He notes no change in stool caliber, abdominal pain, rectal pain, nausea, vomiting, hematemesis or weight loss  Past Medical History  Diagnosis Date  . Anemia   . Tobacco abuse   . High cholesterol    No past surgical history on file.  reports that he has been smoking Cigarettes.  He has a 10 pack-year smoking history. He has never used smokeless tobacco. He reports that he drinks alcohol. He reports that he does not use illicit drugs. family history includes Asthma in his cousin; Cancer in his maternal aunt and maternal grandmother; Heart disease in his father; Hyperlipidemia in his father; Hypertension in his mother and other; and Rheum arthritis in his mother. Allergies  Allergen Reactions  . Penicillins    Outpatient Encounter Prescriptions as of 04/22/2011  Medication Sig Dispense Refill  . Fenofibrate (LIPOFEN) 150 MG CAPS Take 1 capsule (150 mg total) by mouth daily.  60 each  0  . peg 3350 powder (MOVIPREP) 100 G SOLR Take 1 kit (100 g total) by mouth once.  1 kit  0  . DISCONTD: Melatonin 3 MG CAPS One 30 minutes before desired bedtime as needed  30 capsule  0    Review of Systems: Pertinent positive and negative review of systems were noted in the above HPI section. All other review of systems were otherwise negative.  Physical  Exam: General: Well developed , well nourished, no acute distress Head: Normocephalic and atraumatic Eyes:  sclerae anicteric, EOMI Ears: Normal auditory acuity Mouth: No deformity or lesions Neck: Supple, no masses or thyromegaly Lungs: Clear throughout to auscultation Heart: Regular rate and rhythm; no murmurs, rubs or bruits Abdomen: Soft, non tender and non distended. No masses, hepatosplenomegaly or hernias noted. Normal Bowel sounds Rectal: Patient states he had a rectal exam done by Dr. Yetta Barre that was unremarkable in late March, my exam is deferred to colonoscopy Musculoskeletal: Symmetrical with no gross deformities  Skin: No lesions on visible extremities Pulses:  Normal pulses noted Extremities: No clubbing, cyanosis, edema or deformities noted Neurological: Alert oriented x 4, grossly nonfocal Cervical Nodes:  No significant cervical adenopathy Inguinal Nodes: No significant inguinal adenopathy Psychological:  Alert and cooperative. Normal mood and affect  Assessment and Recommendations:  1. Hematochezia and change in bowel habits. Rule out colorectal neoplasms, inflammatory bowel disease, hemorrhoids and other disorders. The risks, benefits, and alternatives to colonoscopy with possible biopsy, possible destruction of internal hemorrhoids and possible polypectomy were discussed with the patient and they consent to proceed.   2. Iron deficiency anemia. Further evaluation with colonoscopy as above. Iron replacement per Dr. Yetta Barre following colonoscopy. If no source for iron deficiency is noted on colonoscopy consider further evaluation with upper endoscopy and if that is negative capsule endoscopy.

## 2011-04-22 NOTE — Patient Instructions (Signed)
You have been scheduled for a Colonoscopy and separate instruction sheets given.  Your prep has been sent to the pharmacy.  Cc: Sanda Linger, MD

## 2011-04-24 LAB — HM COLONOSCOPY

## 2011-04-25 ENCOUNTER — Ambulatory Visit (AMBULATORY_SURGERY_CENTER): Payer: Managed Care, Other (non HMO) | Admitting: Gastroenterology

## 2011-04-25 ENCOUNTER — Encounter: Payer: Self-pay | Admitting: Gastroenterology

## 2011-04-25 DIAGNOSIS — D129 Benign neoplasm of anus and anal canal: Secondary | ICD-10-CM

## 2011-04-25 DIAGNOSIS — R195 Other fecal abnormalities: Secondary | ICD-10-CM

## 2011-04-25 DIAGNOSIS — K921 Melena: Secondary | ICD-10-CM

## 2011-04-25 DIAGNOSIS — D509 Iron deficiency anemia, unspecified: Secondary | ICD-10-CM

## 2011-04-25 DIAGNOSIS — D126 Benign neoplasm of colon, unspecified: Secondary | ICD-10-CM

## 2011-04-25 DIAGNOSIS — D128 Benign neoplasm of rectum: Secondary | ICD-10-CM

## 2011-04-25 DIAGNOSIS — K5730 Diverticulosis of large intestine without perforation or abscess without bleeding: Secondary | ICD-10-CM

## 2011-04-25 MED ORDER — SODIUM CHLORIDE 0.9 % IV SOLN: 500.0000 mL | INTRAVENOUS | Status: DC

## 2011-04-25 MED ORDER — FERROUS SULFATE 325 (65 FE) MG PO TBEC: 325.0000 mg | DELAYED_RELEASE_TABLET | Freq: Two times a day (BID) | ORAL | Status: DC

## 2011-04-28 ENCOUNTER — Telehealth: Payer: Self-pay | Admitting: *Deleted

## 2011-04-28 NOTE — Telephone Encounter (Signed)
No answer, no message left at home or work #.

## 2011-04-29 ENCOUNTER — Encounter: Payer: Self-pay | Admitting: Gastroenterology

## 2011-05-14 ENCOUNTER — Encounter (HOSPITAL_BASED_OUTPATIENT_CLINIC_OR_DEPARTMENT_OTHER): Payer: Managed Care, Other (non HMO)

## 2011-05-27 ENCOUNTER — Encounter (HOSPITAL_BASED_OUTPATIENT_CLINIC_OR_DEPARTMENT_OTHER): Payer: Managed Care, Other (non HMO)

## 2011-10-20 ENCOUNTER — Encounter: Payer: Self-pay | Admitting: *Deleted

## 2012-04-12 ENCOUNTER — Encounter (HOSPITAL_COMMUNITY): Payer: Self-pay | Admitting: Emergency Medicine

## 2012-04-12 ENCOUNTER — Emergency Department (HOSPITAL_COMMUNITY)
Admission: EM | Admit: 2012-04-12 | Discharge: 2012-04-13 | Disposition: A | Discharge status: Home / Self Care | Payer: Managed Care, Other (non HMO) | Attending: Emergency Medicine | Admitting: Emergency Medicine

## 2012-04-12 DIAGNOSIS — N390 Urinary tract infection, site not specified: Secondary | ICD-10-CM

## 2012-04-12 DIAGNOSIS — R197 Diarrhea, unspecified: Secondary | ICD-10-CM | POA: Insufficient documentation

## 2012-04-12 DIAGNOSIS — R112 Nausea with vomiting, unspecified: Secondary | ICD-10-CM | POA: Insufficient documentation

## 2012-04-12 LAB — CBC
HCT: 41.2 % (ref 39.0–52.0)
Hemoglobin: 14.3 g/dL (ref 13.0–17.0)
MCH: 29.5 pg (ref 26.0–34.0)
MCHC: 34.7 g/dL (ref 30.0–36.0)
MCV: 85.1 fL (ref 78.0–100.0)
Platelets: 285 10*3/uL (ref 150–400)
RBC: 4.84 MIL/uL (ref 4.22–5.81)
RDW: 13.9 % (ref 11.5–15.5)
WBC: 10 10*3/uL (ref 4.0–10.5)

## 2012-04-12 LAB — DIFFERENTIAL
Basophils Absolute: 0 10*3/uL (ref 0.0–0.1)
Basophils Relative: 0 % (ref 0–1)
Lymphocytes Relative: 10 % — ABNORMAL LOW (ref 12–46)
Neutro Abs: 8.5 10*3/uL — ABNORMAL HIGH (ref 1.7–7.7)

## 2012-04-12 LAB — BASIC METABOLIC PANEL
CO2: 25 mEq/L (ref 19–32)
Calcium: 10 mg/dL (ref 8.4–10.5)
Chloride: 98 mEq/L (ref 96–112)
GFR calc Af Amer: >90 mL/min (ref >90)
Sodium: 137 mEq/L (ref 135–145)

## 2012-04-12 NOTE — ED Notes (Signed)
Pt reports nausea, vomiting, abdominal cramping; body aches; unable to hold down fluids of food- symptoms starting today.

## 2012-04-13 LAB — URINE CULTURE
Culture  Setup Time: 201304160158
Culture: NO GROWTH

## 2012-04-13 LAB — URINE MICROSCOPIC-ADD ON

## 2012-04-13 LAB — URINALYSIS, ROUTINE W REFLEX MICROSCOPIC
Glucose, UA: NEGATIVE mg/dL
Ketones, ur: 15 mg/dL — AB
Nitrite: NEGATIVE
Protein, ur: 30 mg/dL — AB
pH: 6 (ref 5.0–8.0)

## 2012-04-13 MED ORDER — SODIUM CHLORIDE 0.9 % IV BOLUS (SEPSIS)
1000.0000 mL | Freq: Once | INTRAVENOUS | Status: AC
  Administered 2012-04-13: 1000 mL via INTRAVENOUS

## 2012-04-13 MED ORDER — MORPHINE SULFATE 4 MG/ML IJ SOLN
4.0000 mg | Freq: Once | INTRAMUSCULAR | Status: AC
  Administered 2012-04-13: 4 mg via INTRAVENOUS
  Filled 2012-04-13: qty 1

## 2012-04-13 MED ORDER — CIPROFLOXACIN IN D5W 400 MG/200ML IV SOLN
400.0000 mg | Freq: Once | INTRAVENOUS | Status: AC
  Administered 2012-04-13: 400 mg via INTRAVENOUS
  Filled 2012-04-13: qty 200

## 2012-04-13 MED ORDER — ONDANSETRON HCL 4 MG PO TABS: 4.0000 mg | ORAL_TABLET | Freq: Four times a day (QID) | ORAL | Status: AC

## 2012-04-13 MED ORDER — CIPROFLOXACIN HCL 500 MG PO TABS: 500.0000 mg | ORAL_TABLET | Freq: Two times a day (BID) | ORAL | Status: AC

## 2012-04-13 MED ORDER — ONDANSETRON HCL 4 MG/2ML IJ SOLN
4.0000 mg | Freq: Once | INTRAMUSCULAR | Status: AC
  Administered 2012-04-13: 4 mg via INTRAVENOUS
  Filled 2012-04-13: qty 2

## 2012-04-13 NOTE — ED Notes (Signed)
Pt states active vomiting since 07:30 this morning. Pt states that he was fine yesterday, he woke up vomiting and has been vomiting and having diarrhea all day. Pt states back pain as well. Pt has not vomited since being in room.

## 2012-04-13 NOTE — Discharge Instructions (Signed)
Urinary Tract Infection Infections of the urinary tract can start in several places. A bladder infection (cystitis), a kidney infection (pyelonephritis), and a prostate infection (prostatitis) are different types of urinary tract infections (UTIs). They usually get better if treated with medicines (antibiotics) that kill germs. Take all the medicine until it is gone. You or your child may feel better in a few days, but TAKE ALL MEDICINE or the infection may not respond and may become more difficult to treat. HOME CARE INSTRUCTIONS   Drink enough water and fluids to keep the urine clear or pale yellow. Cranberry juice is especially recommended, in addition to large amounts of water.   Avoid caffeine, tea, and carbonated beverages. They tend to irritate the bladder.   Alcohol may irritate the prostate.   Only take over-the-counter or prescription medicines for pain, discomfort, or fever as directed by your caregiver.  To prevent further infections:  Empty the bladder often. Avoid holding urine for long periods of time.   After a bowel movement, women should cleanse from front to back. Use each tissue only once.   Empty the bladder before and after sexual intercourse.  FINDING OUT THE RESULTS OF YOUR TEST Not all test results are available during your visit. If your or your child's test results are not back during the visit, make an appointment with your caregiver to find out the results. Do not assume everything is normal if you have not heard from your caregiver or the medical facility. It is important for you to follow up on all test results. SEEK MEDICAL CARE IF:   There is back pain.   Your baby is older than 3 months with a rectal temperature of 100.5 F (38.1 C) or higher for more than 1 day.   Your or your child's problems (symptoms) are no better in 3 days. Return sooner if you or your child is getting worse.  SEEK IMMEDIATE MEDICAL CARE IF:   There is severe back pain or lower  abdominal pain.   You or your child develops chills.   You have a fever.   Your baby is older than 3 months with a rectal temperature of 102 F (38.9 C) or higher.   Your baby is 4 months old or younger with a rectal temperature of 100.4 F (38 C) or higher.   There is nausea or vomiting.   There is continued burning or discomfort with urination.  MAKE SURE YOU:   Understand these instructions.   Will watch your condition.   Will get help right away if you are not doing well or get worse.  Document Released: 09/24/2005 Document Revised: 12/04/2011 Document Reviewed: 04/29/2007 [facility] Patient Information 2012 [place], [place].  Diet for Diarrhea, Adult Having frequent, runny stools (diarrhea) has many causes. Diarrhea may be caused or worsened by food or drink. Diarrhea may be relieved by changing your diet. IF YOU ARE NOT TOLERATING SOLID FOODS:  Drink enough water and fluids to keep your urine clear or pale yellow.   Avoid sugary drinks and sodas as well as milk-based beverages.   Avoid beverages containing caffeine and alcohol.   You may try rehydrating beverages. You can make your own by following this recipe:    tsp table salt.    tsp baking soda.   ? tsp salt substitute (potassium chloride).   1 tbs + 1 tsp sugar.   1 qt water.  As your stools become more solid, you can start eating solid foods. Add foods  one at a time. If a certain food causes your diarrhea to get worse, avoid that food and try other foods. A low fiber, low-fat, and lactose-free diet is recommended. Small, frequent meals may be better tolerated.  Starches  Allowed:  White, [place], and pita breads, plain rolls, buns, bagels. Plain muffins, matzo. Soda, saltine, or graham crackers. Pretzels, melba toast, zwieback. Cooked cereals made with water: cornmeal, farina, cream cereals. Dry cereals: refined corn, wheat, rice. Potatoes prepared any way without skins, refined macaroni, spaghetti, noodles,  refined rice.   Avoid:  Bread, rolls, or crackers made with whole wheat, multi-grains, rye, bran seeds, nuts, or coconut. Corn tortillas or taco shells. Cereals containing whole grains, multi-grains, bran, coconut, nuts, or raisins. Cooked or dry oatmeal. Coarse wheat cereals, granola. Cereals advertised as "high-fiber." Potato skins. Whole grain pasta, wild or brown rice. Popcorn. Sweet potatoes/yams. Sweet rolls, doughnuts, waffles, pancakes, sweet breads.  Vegetables  Allowed: Strained tomato and vegetable juices. Most well-cooked and canned vegetables without seeds. Fresh: Tender lettuce, cucumber without the skin, cabbage, spinach, bean sprouts.   Avoid: Fresh, cooked, or canned: Artichokes, baked beans, beet greens, broccoli, Brussels sprouts, corn, kale, legumes, peas, sweet potatoes. Cooked: Green or red cabbage, spinach. Avoid large servings of any vegetables, because vegetables shrink when cooked, and they contain more fiber per serving than fresh vegetables.  Fruit  Allowed: All fruit juices except prune juice. Cooked or canned: Apricots, applesauce, cantaloupe, cherries, fruit cocktail, grapefruit, grapes, kiwi, mandarin oranges, peaches, pears, plums, watermelon. Fresh: Apples without skin, ripe banana, grapes, cantaloupe, cherries, grapefruit, peaches, oranges, plums. Keep servings limited to  cup or 1 piece.   Avoid: Fresh: Apple with skin, apricots, mango, pears, raspberries, strawberries. Prune juice, stewed or dried prunes. Dried fruits, raisins, dates. Large servings of all fresh fruits.  Meat and Meat Substitutes  Allowed: Ground or well-cooked tender beef, ham, veal, lamb, pork, or poultry. Eggs, plain cheese. Fish, oysters, shrimp, lobster, other seafoods. Liver, organ meats.   Avoid: Tough, fibrous meats with gristle. Peanut butter, smooth or chunky. Cheese, nuts, seeds, legumes, dried peas, beans, lentils.  Milk  Allowed: Yogurt, lactose-free milk, kefir, drinkable  yogurt, buttermilk, soy milk.   Avoid: Milk, chocolate milk, beverages made with milk, such as milk shakes.  Soups  Allowed: Bouillon, broth, or soups made from allowed foods. Any strained soup.   Avoid: Soups made from vegetables that are not allowed, cream or milk-based soups.  Desserts and Sweets  Allowed: Sugar-free gelatin, sugar-free frozen ice pops made without sugar alcohol.   Avoid: Plain cakes and cookies, pie made with allowed fruit, pudding, custard, cream pie. Gelatin, fruit, ice, sherbet, frozen ice pops. Ice cream, ice milk without nuts. Plain hard candy, honey, jelly, molasses, syrup, sugar, chocolate syrup, gumdrops, marshmallows.  Fats and Oils  Allowed: Avoid any fats and oils.   Avoid: Seeds, nuts, olives, avocados. Margarine, butter, cream, mayonnaise, salad oils, plain salad dressings made from allowed foods. Plain gravy, crisp bacon without rind.  Beverages  Allowed: Water, decaffeinated teas, oral rehydration solutions, sugar-free beverages.   Avoid: Fruit juices, caffeinated beverages (coffee, tea, soda or pop), alcohol, sports drinks, or lemon-lime soda or pop.  Condiments  Allowed: Ketchup, mustard, horseradish, vinegar, cream sauce, cheese sauce, cocoa powder. Spices in moderation: allspice, basil, bay leaves, celery powder or leaves, cinnamon, cumin powder, curry powder, ginger, mace, marjoram, onion or garlic powder, oregano, paprika, parsley flakes, ground pepper, rosemary, sage, savory, tarragon, thyme, turmeric.   Avoid: Coconut, honey.  Weight  Monitoring: Weigh yourself every day. You should weigh yourself in the morning after you urinate and before you eat breakfast. Wear the same amount of clothing when you weigh yourself. Record your weight daily. Bring your recorded weights to your clinic visits. Tell your caregiver right away if you have gained 3 lb/1.4 kg or more in 1 day, 5 lb/2.3 kg in a week, or whatever amount you were told to report. SEEK  IMMEDIATE MEDICAL CARE IF:   You are unable to keep fluids down.   You start to throw up (vomit) or diarrhea keeps coming back (persistent).   Abdominal pain develops, increases, or can be felt in one place (localizes).   You have an oral temperature above 102 F (38.9 C), not controlled by medicine.   Diarrhea contains blood or mucus.   You develop excessive weakness, dizziness, fainting, or extreme thirst.  MAKE SURE YOU:   Understand these instructions.   Will watch your condition.   Will get help right away if you are not doing well or get worse.  Document Released: 03/06/2004 Document Revised: 12/04/2011 Document Reviewed: 06/28/2009 [facility] Patient Information 2012 [place], [place].

## 2012-04-13 NOTE — ED Provider Notes (Signed)
History     CSN: [id number]  Arrival date & time 04/12/12  2137   First MD Initiated Contact with Patient 04/13/12 0103      Chief Complaint  Patient presents with  . Emesis    (Consider location/radiation/quality/duration/timing/severity/associated sxs/prior treatment) HPI  41 year old male presents with chief complaints of abdominal and back pain with associated nausea, vomiting, and diarrhea. Patient states symptoms began this morning. He felt nauseated and proceeds to have 3 bouts of nonbilious, nonbloody vomiting. He also has had multiple bouts of nonbloody diarrhea throughout the day. He felt feverish with a home temperature of 10 67F. Has not taken anything for it. Patient described his low abdominal pain and low back pain as a sharp, stabbing sensation worsening with movement and improved with rest. He denies burning urination, or other urinary symptoms. Patient states he was fine yesterday. Patient denies any recent trauma, travel, or any exotic food. He denies any recent antibiotic use  Past Medical History  Diagnosis Date  . Anemia   . Tobacco abuse   . High cholesterol     No past surgical history on file.  Family History  Problem Relation Age of Onset  . Hypertension Other   . Hyperlipidemia Father   . Heart disease Father   . Hypertension Father   . Cancer Maternal Aunt   . Cancer Maternal Grandmother   . Rheum arthritis Mother   . Hypertension Mother   . Asthma Cousin   . Hypertension Sister     History  Substance Use Topics  . Smoking status: Current Everyday Smoker -- 0.5 packs/day for 20 years    Types: Cigarettes  . Smokeless tobacco: Never Used  . Alcohol Use: Yes     2-3 beers a day      Review of Systems  All other systems reviewed and are negative.    Allergies  Penicillins  Home Medications  No current outpatient prescriptions on file.  BP 139/100  Pulse 106  Temp(Src) 101.1 F (38.4 C) (Oral)  Resp 16  SpO2 97%  Physical  Exam  Nursing note and vitals reviewed. Constitutional: He appears well-developed and well-nourished. No distress.       Awake, alert, nontoxic appearance  HENT:  Head: Atraumatic.  Eyes: Conjunctivae are normal. Right eye exhibits no discharge. Left eye exhibits no discharge.  Neck: Normal range of motion. Neck supple.  Cardiovascular: Normal rate and regular rhythm.   Pulmonary/Chest: Effort normal. No respiratory distress. He exhibits no tenderness.  Abdominal: Soft. There is tenderness in the suprapubic area and left lower quadrant. There is no rigidity, no rebound, no guarding, no CVA tenderness, no tenderness at McBurney's point and negative Murphy's sign.       Patient does not have any obvious CVA tenderness, but he does have pain to his low back bilaterally on percussion. No midline spine tenderness noted.  No rash, no overlying skin changes.  Musculoskeletal: He exhibits no tenderness.       ROM appears intact, no obvious focal weakness  Neurological: He is alert.  Skin: Skin is warm and dry. No rash noted.  Psychiatric: He has a normal mood and affect.    ED Course  Procedures (including critical care time)  Labs Reviewed  DIFFERENTIAL - Abnormal; Notable for the following:    Neutrophils Relative 85 (*)    Neutro Abs 8.5 (*)    Lymphocytes Relative 10 (*)    All other components within normal limits  BASIC METABOLIC PANEL -  Abnormal; Notable for the following:    Glucose, Bld 105 (*)    GFR calc non Af Amer 90 (*)    All other components within normal limits  URINALYSIS, ROUTINE W REFLEX MICROSCOPIC - Abnormal; Notable for the following:    Color, Urine AMBER (*) BIOCHEMICALS MAY BE AFFECTED BY COLOR   Specific Gravity, Urine 1.031 (*)    Bilirubin Urine SMALL (*)    Ketones, ur 15 (*)    Protein, ur 30 (*)    Leukocytes, UA MODERATE (*)    All other components within normal limits  URINE MICROSCOPIC-ADD ON - Abnormal; Notable for the following:    Squamous  Epithelial / LPF FEW (*)    Bacteria, UA FEW (*)    Casts HYALINE CASTS (*)    All other components within normal limits  CBC   No results found.   No diagnosis found.    MDM  Patient with low abdominal pain, low back pain, with associated nausea, vomiting, diarrhea. Shows evidence of urinary tract infection. He has normal electrolytes and normal white count. Plan to treat with IV fluid, Cipro, pain medication and antinausea medication. Plan to discharge patient once patient able to tolerate by mouth. He appears to be normal, and stable on exam   3:17 AM Patient has not had any bouts of vomiting or diarrhea here in the ED. Patient states he feels much better overall with current treatment. He is able to tolerate by mouth. Will discharge with antibiotic treat for urinary tract infection. Patient agrees to follow with his primary care Dr. for further evaluation.     Fayrene Helper, PA-C 04/13/12 [phone]

## 2012-04-13 NOTE — ED Provider Notes (Signed)
Medical screening examination/treatment/procedure(s) were performed by non-physician practitioner and as supervising physician I was immediately available for consultation/collaboration.  Charolett Yarrow M Elior Robinette, MD 04/13/12 0804 

## 2012-04-13 NOTE — ED Notes (Signed)
Pt able to tolerate PO fluids.  

## 2012-11-02 ENCOUNTER — Ambulatory Visit (INDEPENDENT_AMBULATORY_CARE_PROVIDER_SITE_OTHER): Payer: Managed Care, Other (non HMO) | Admitting: Internal Medicine

## 2012-11-02 ENCOUNTER — Other Ambulatory Visit (INDEPENDENT_AMBULATORY_CARE_PROVIDER_SITE_OTHER): Payer: Managed Care, Other (non HMO)

## 2012-11-02 ENCOUNTER — Encounter: Payer: Self-pay | Admitting: Internal Medicine

## 2012-11-02 VITALS — BP 140/100 | HR 80 | Temp 97.1°F | Resp 16 | Ht 71.0 in | Wt 209.5 lb

## 2012-11-02 DIAGNOSIS — R7309 Other abnormal glucose: Secondary | ICD-10-CM

## 2012-11-02 DIAGNOSIS — I10 Essential (primary) hypertension: Secondary | ICD-10-CM

## 2012-11-02 DIAGNOSIS — Z0000 Encounter for general adult medical examination without abnormal findings: Secondary | ICD-10-CM

## 2012-11-02 DIAGNOSIS — Z136 Encounter for screening for cardiovascular disorders: Secondary | ICD-10-CM

## 2012-11-02 LAB — COMPREHENSIVE METABOLIC PANEL
ALT: 27 U/L (ref 0–53)
AST: 34 U/L (ref 0–37)
Albumin: 4 g/dL (ref 3.5–5.2)
Alkaline Phosphatase: 59 U/L (ref 39–117)
Glucose, Bld: 73 mg/dL (ref 70–99)
Potassium: 4.4 mEq/L (ref 3.5–5.1)
Sodium: 138 mEq/L (ref 135–145)
Total Bilirubin: 0.5 mg/dL (ref 0.3–1.2)
Total Protein: 7.4 g/dL (ref 6.0–8.3)

## 2012-11-02 LAB — CBC WITH DIFFERENTIAL/PLATELET
Eosinophils Absolute: 0.2 10*3/uL (ref 0.0–0.7)
Eosinophils Relative: 2.2 % (ref 0.0–5.0)
HCT: 36.7 % — ABNORMAL LOW (ref 39.0–52.0)
Lymphs Abs: 2.3 10*3/uL (ref 0.7–4.0)
MCHC: 33.4 g/dL (ref 30.0–36.0)
MCV: 84.2 fl (ref 78.0–100.0)
Monocytes Absolute: 0.8 10*3/uL (ref 0.1–1.0)
Neutrophils Relative %: 57.9 % (ref 43.0–77.0)
Platelets: 250 10*3/uL (ref 150.0–400.0)
WBC: 7.9 10*3/uL (ref 4.5–10.5)

## 2012-11-02 LAB — LIPID PANEL
HDL: 64.3 mg/dL (ref >39.00)
Total CHOL/HDL Ratio: 4
VLDL: 97.4 mg/dL — ABNORMAL HIGH (ref 0.0–40.0)

## 2012-11-02 LAB — PSA: PSA: 1.26 ng/mL (ref 0.10–4.00)

## 2012-11-02 LAB — HEMOGLOBIN A1C: Hgb A1c MFr Bld: 5.3 % (ref 4.6–6.5)

## 2012-11-02 LAB — LDL CHOLESTEROL, DIRECT: Direct LDL: 84.4 mg/dL

## 2012-11-02 MED ORDER — NEBIVOLOL HCL 5 MG PO TABS: 5.0000 mg | ORAL_TABLET | Freq: Every day | ORAL | Status: DC

## 2012-11-02 NOTE — Assessment & Plan Note (Signed)
His EKG is normal, I will check his labs today to look for secondary causes and end organ damage, he will start Bystolic to lower his BP

## 2012-11-02 NOTE — Assessment & Plan Note (Signed)
Exam done, vaccines were updated, labs ordered, pt ed material was given 

## 2012-11-02 NOTE — Assessment & Plan Note (Signed)
I will check his a1c today to see if he has developed DM II 

## 2012-11-02 NOTE — Patient Instructions (Signed)
Health Maintenance, Males A healthy lifestyle and preventative care can promote health and wellness.  Maintain regular health, dental, and eye exams.  Eat a healthy diet. Foods like vegetables, fruits, whole grains, low-fat dairy products, and lean protein foods contain the nutrients you need without too many calories. Decrease your intake of foods high in solid fats, added sugars, and salt. Get information about a proper diet from your caregiver, if necessary.  Regular physical exercise is one of the most important things you can do for your health. Most adults should get at least 150 minutes of moderate-intensity exercise (any activity that increases your heart rate and causes you to sweat) each week. In addition, most adults need muscle-strengthening exercises on 2 or more days a week.   Maintain a healthy weight. The body mass index (BMI) is a screening tool to identify possible weight problems. It provides an estimate of body fat based on height and weight. Your caregiver can help determine your BMI, and can help you achieve or maintain a healthy weight. For adults 20 years and older:  A BMI below 18.5 is considered underweight.  A BMI of 18.5 to 24.9 is normal.  A BMI of 25 to 29.9 is considered overweight.  A BMI of 30 and above is considered obese.  Maintain normal blood lipids and cholesterol by exercising and minimizing your intake of saturated fat. Eat a balanced diet with plenty of fruits and vegetables. Blood tests for lipids and cholesterol should begin at age 20 and be repeated every 5 years. If your lipid or cholesterol levels are high, you are over 50, or you are a high risk for heart disease, you may need your cholesterol levels checked more frequently.Ongoing high lipid and cholesterol levels should be treated with medicines, if diet and exercise are not effective.  If you smoke, find out from your caregiver how to quit. If you do not use tobacco, do not start.  If you  choose to drink alcohol, do not exceed 2 drinks per day. One drink is considered to be 12 ounces (355 mL) of beer, 5 ounces (148 mL) of wine, or 1.5 ounces (44 mL) of liquor.  Avoid use of street drugs. Do not share needles with anyone. Ask for help if you need support or instructions about stopping the use of drugs.  High blood pressure causes heart disease and increases the risk of stroke. Blood pressure should be checked at least every 1 to 2 years. Ongoing high blood pressure should be treated with medicines if weight loss and exercise are not effective.  If you are 45 to 42 years old, ask your caregiver if you should take aspirin to prevent heart disease.  Diabetes screening involves taking a blood sample to check your fasting blood sugar level. This should be done once every 3 years, after age 45, if you are within normal weight and without risk factors for diabetes. Testing should be considered at a younger age or be carried out more frequently if you are overweight and have at least 1 risk factor for diabetes.  Colorectal cancer can be detected and often prevented. Most routine colorectal cancer screening begins at the age of 50 and continues through age 75. However, your caregiver may recommend screening at an earlier age if you have risk factors for colon cancer. On a yearly basis, your caregiver may provide home test kits to check for hidden blood in the stool. Use of a small camera at the end of a tube,   to directly examine the colon (sigmoidoscopy or colonoscopy), can detect the earliest forms of colorectal cancer. Talk to your caregiver about this at age 50, when routine screening begins. Direct examination of the colon should be repeated every 5 to 10 years through age 75, unless early forms of pre-cancerous polyps or small growths are found.  Hepatitis C blood testing is recommended for all people born from 1945 through 1965 and any individual with known risks for hepatitis C.  Healthy  men should no longer receive prostate-specific antigen (PSA) blood tests as part of routine cancer screening. Consult with your caregiver about prostate cancer screening.  Testicular cancer screening is not recommended for adolescents or adult males who have no symptoms. Screening includes self-exam, caregiver exam, and other screening tests. Consult with your caregiver about any symptoms you have or any concerns you have about testicular cancer.  Practice safe sex. Use condoms and avoid high-risk sexual practices to reduce the spread of sexually transmitted infections (STIs).  Use sunscreen with a sun protection factor (SPF) of 30 or greater. Apply sunscreen liberally and repeatedly throughout the day. You should seek shade when your shadow is shorter than you. Protect yourself by wearing long sleeves, pants, a wide-brimmed hat, and sunglasses year round, whenever you are outdoors.  Notify your caregiver of new moles or changes in moles, especially if there is a change in shape or color. Also notify your caregiver if a mole is larger than the size of a pencil eraser.  A one-time screening for abdominal aortic aneurysm (AAA) and surgical repair of large AAAs by sound wave imaging (ultrasonography) is recommended for ages 65 to 75 years who are current or former smokers.  Stay current with your immunizations. Document Released: 06/12/2008 Document Revised: 03/08/2012 Document Reviewed: 05/12/2011 [employer] Patient Information 2013 [employer]. Hypertension As your heart beats, it forces blood through your arteries. This force is your blood pressure. If the pressure is too high, it is called hypertension (HTN) or high blood pressure. HTN is dangerous because you may have it and not know it. High blood pressure may mean that your heart has to work harder to pump blood. Your arteries may be narrow or stiff. The extra work puts you at risk for heart disease, stroke, and other problems.  Blood pressure  consists of two numbers, a higher number over a lower, 110/72, for example. It is stated as "110 over 72." The ideal is below 120 for the top number (systolic) and under 80 for the bottom (diastolic). Write down your blood pressure today. You should pay close attention to your blood pressure if you have certain conditions such as:  Heart failure.  Prior heart attack.  Diabetes  Chronic kidney disease.  Prior stroke.  Multiple risk factors for heart disease. To see if you have HTN, your blood pressure should be measured while you are seated with your arm held at the level of the heart. It should be measured at least twice. A one-time elevated blood pressure reading (especially in the Emergency Department) does not mean that you need treatment. There may be conditions in which the blood pressure is different between your right and left arms. It is important to see your caregiver soon for a recheck. Most people have essential hypertension which means that there is not a specific cause. This type of high blood pressure may be lowered by changing lifestyle factors such as:  Stress.  Smoking.  Lack of exercise.  Excessive weight.  Drug/tobacco/alcohol use.    Eating less salt. Most people do not have symptoms from high blood pressure until it has caused damage to the body. Effective treatment can often prevent, delay or reduce that damage. TREATMENT  When a cause has been identified, treatment for high blood pressure is directed at the cause. There are a large number of medications to treat HTN. These fall into several categories, and your caregiver will help you select the medicines that are best for you. Medications may have side effects. You should review side effects with your caregiver. If your blood pressure stays high after you have made lifestyle changes or started on medicines,   Your medication(s) may need to be changed.  Other problems may need to be addressed.  Be certain you  understand your prescriptions, and know how and when to take your medicine.  Be sure to follow up with your caregiver within the time frame advised (usually within two weeks) to have your blood pressure rechecked and to review your medications.  If you are taking more than one medicine to lower your blood pressure, make sure you know how and at what times they should be taken. Taking two medicines at the same time can result in blood pressure that is too low. SEEK IMMEDIATE MEDICAL CARE IF:  You develop a severe headache, blurred or changing vision, or confusion.  You have unusual weakness or numbness, or a faint feeling.  You have severe chest or abdominal pain, vomiting, or breathing problems. MAKE SURE YOU:   Understand these instructions.  Will watch your condition.  Will get help right away if you are not doing well or get worse. Document Released: 12/15/2005 Document Revised: 03/08/2012 Document Reviewed: 08/04/2008 Exit[employer] Patient Information 2013 [employer].  

## 2012-11-02 NOTE — Progress Notes (Signed)
Subjective:    Patient ID: Tony Robertson, male    DOB: 02-Oct-1971, 41 y.o.   MRN: [id number]  Hypertension This is a new problem. The current episode started more than 1 month ago. The problem has been gradually worsening since onset. The problem is uncontrolled. Pertinent negatives include no anxiety, blurred vision, chest pain, headaches, malaise/fatigue, neck pain, orthopnea, palpitations, peripheral edema, PND, shortness of breath or sweats. Risk factors for coronary artery disease include smoking/tobacco exposure. Past treatments include nothing. Identifiable causes of hypertension include sleep apnea.      Review of Systems  Constitutional: Negative for fever, chills, malaise/fatigue, diaphoresis, activity change, appetite change, fatigue and unexpected weight change.  HENT: Negative.  Negative for neck pain.   Eyes: Negative.  Negative for blurred vision.  Respiratory: Positive for apnea. Negative for cough, choking, chest tightness, shortness of breath, wheezing and stridor.   Cardiovascular: Negative for chest pain, palpitations, orthopnea, leg swelling and PND.  Gastrointestinal: Negative for nausea, vomiting, abdominal pain, diarrhea and constipation.  Genitourinary: Negative for dysuria, urgency, frequency, hematuria, flank pain, decreased urine volume, discharge, penile swelling, scrotal swelling, enuresis, difficulty urinating, genital sores, penile pain and testicular pain.  Musculoskeletal: Negative for myalgias, back pain, joint swelling, arthralgias and gait problem.  Skin: Negative for color change, pallor, rash and wound.  Neurological: Negative for dizziness, tremors, seizures, syncope, facial asymmetry, speech difficulty, weakness, light-headedness, numbness and headaches.  Hematological: Negative for adenopathy. Does not bruise/bleed easily.  Psychiatric/Behavioral: Negative.        Objective:   Physical Exam  Vitals reviewed. Constitutional: He is oriented to  person, place, and time. He appears well-developed and well-nourished. No distress.  HENT:  Head: Normocephalic and atraumatic.  Mouth/Throat: Oropharynx is clear and moist. No oropharyngeal exudate.  Eyes: Conjunctivae normal are normal. Right eye exhibits no discharge. Left eye exhibits no discharge. No scleral icterus.  Neck: Normal range of motion. Neck supple. No JVD present. No tracheal deviation present. No thyromegaly present.  Cardiovascular: Normal rate, regular rhythm, normal heart sounds and intact distal pulses.  Exam reveals no gallop and no friction rub.   No murmur heard. Pulmonary/Chest: Effort normal and breath sounds normal. No stridor. No respiratory distress. He has no wheezes. He has no rales. He exhibits no tenderness.  Abdominal: Soft. Bowel sounds are normal. He exhibits no distension and no mass. There is no tenderness. There is no rebound and no guarding. Hernia confirmed negative in the right inguinal area and confirmed negative in the left inguinal area.  Genitourinary: Rectum normal, testes normal and penis normal. Rectal exam shows no external hemorrhoid, no internal hemorrhoid, no fissure, no mass, no tenderness and anal tone normal. Guaiac negative stool. Prostate is enlarged (1+ smooth symm BPH). Prostate is not tender. Right testis shows no mass, no swelling and no tenderness. Right testis is descended. Left testis shows no mass, no swelling and no tenderness. Left testis is descended. Circumcised. No penile erythema or penile tenderness. No discharge found.  Musculoskeletal: Normal range of motion. He exhibits no edema and no tenderness.  Lymphadenopathy:    He has no cervical adenopathy.       Right: No inguinal adenopathy present.       Left: No inguinal adenopathy present.  Neurological: He is oriented to person, place, and time.  Skin: Skin is warm and dry. No rash noted. He is not diaphoretic. No erythema. No pallor.  Psychiatric: He has a normal mood and  affect. His behavior is normal.  Judgment and thought content normal.      Lab Results  Component Value Date   WBC 10.0 04/12/2012   HGB 14.3 04/12/2012   HCT 41.2 04/12/2012   PLT 285 04/12/2012   GLUCOSE 105* 04/12/2012   CHOL 227* 03/12/2011   TRIG 225.0* 03/12/2011   HDL 66.20 03/12/2011   LDLDIRECT 91.0 03/12/2011   ALT 37 03/12/2011   AST 43* 03/12/2011   NA 137 04/12/2012   K 3.5 04/12/2012   CL 98 04/12/2012   CREATININE 1.02 04/12/2012   BUN 13 04/12/2012   CO2 25 04/12/2012   TSH 0.69 03/12/2011   PSA 0.94 03/12/2011      Assessment & Plan:

## 2012-11-03 ENCOUNTER — Encounter: Payer: Self-pay | Admitting: Internal Medicine

## 2013-01-11 ENCOUNTER — Ambulatory Visit: Payer: Managed Care, Other (non HMO) | Admitting: Internal Medicine

## 2013-01-11 DIAGNOSIS — Z0289 Encounter for other administrative examinations: Secondary | ICD-10-CM

## 2013-08-01 ENCOUNTER — Encounter (HOSPITAL_COMMUNITY): Payer: Self-pay

## 2013-08-01 ENCOUNTER — Emergency Department (HOSPITAL_COMMUNITY)
Admission: EM | Admit: 2013-08-01 | Discharge: 2013-08-01 | Discharge status: Against Medical Advice | Payer: Self-pay | Attending: Emergency Medicine | Admitting: Emergency Medicine

## 2013-08-01 DIAGNOSIS — I10 Essential (primary) hypertension: Secondary | ICD-10-CM | POA: Insufficient documentation

## 2013-08-01 DIAGNOSIS — F17200 Nicotine dependence, unspecified, uncomplicated: Secondary | ICD-10-CM | POA: Insufficient documentation

## 2013-08-01 DIAGNOSIS — R079 Chest pain, unspecified: Secondary | ICD-10-CM | POA: Insufficient documentation

## 2013-08-01 LAB — CBC
HCT: 37.4 % — ABNORMAL LOW (ref 39.0–52.0)
MCHC: 35.8 g/dL (ref 30.0–36.0)
MCV: 85.6 fL (ref 78.0–100.0)
Platelets: 243 10*3/uL (ref 150–400)
RDW: 14 % (ref 11.5–15.5)
WBC: 9.7 10*3/uL (ref 4.0–10.5)

## 2013-08-01 LAB — POCT I-STAT TROPONIN I: Troponin i, poc: 0 ng/mL (ref 0.00–0.08)

## 2013-08-01 LAB — BASIC METABOLIC PANEL
BUN: 17 mg/dL (ref 6–23)
Creatinine, Ser: 1.11 mg/dL (ref 0.50–1.35)
GFR calc Af Amer: >90 mL/min (ref >90)
GFR calc non Af Amer: 80 mL/min — ABNORMAL LOW (ref >90)
Potassium: 3.8 mEq/L (ref 3.5–5.1)

## 2013-08-01 NOTE — ED Notes (Addendum)
Pt presents with multiple complaints, pt c/o Left side chest pain, Right side back pain, weakness, dizziness, and diaphoresis x1 month. Pt also reports having two separate episodes of bloody stool. Pt denies chest pain at this time, pt smells of ETOH. Pt reports he has not taken his BP meds in over 5 months

## 2013-08-11 ENCOUNTER — Ambulatory Visit: Payer: Managed Care, Other (non HMO) | Admitting: Internal Medicine

## 2013-08-11 DIAGNOSIS — Z0289 Encounter for other administrative examinations: Secondary | ICD-10-CM

## 2014-07-31 ENCOUNTER — Encounter: Payer: Managed Care, Other (non HMO) | Admitting: Internal Medicine

## 2014-07-31 DIAGNOSIS — Z0289 Encounter for other administrative examinations: Secondary | ICD-10-CM

## 2014-08-02 ENCOUNTER — Telehealth: Payer: Self-pay | Admitting: Internal Medicine

## 2014-08-02 NOTE — Telephone Encounter (Signed)
Patient no showed for cpe on 08/03.  Please advise.

## 2015-05-29 ENCOUNTER — Encounter: Payer: Managed Care, Other (non HMO) | Admitting: Internal Medicine

## 2015-06-06 ENCOUNTER — Encounter: Payer: Managed Care, Other (non HMO) | Admitting: Internal Medicine

## 2015-06-12 ENCOUNTER — Encounter: Payer: Self-pay | Admitting: Internal Medicine

## 2015-07-18 ENCOUNTER — Encounter: Payer: Self-pay | Admitting: Internal Medicine

## 2016-11-25 ENCOUNTER — Emergency Department (HOSPITAL_COMMUNITY)
Admission: EM | Admit: 2016-11-25 | Discharge: 2016-11-25 | Disposition: A | Discharge status: Home / Self Care | Payer: BLUE CROSS/BLUE SHIELD | Attending: Emergency Medicine | Admitting: Emergency Medicine

## 2016-11-25 ENCOUNTER — Encounter (HOSPITAL_COMMUNITY): Payer: Self-pay | Admitting: Emergency Medicine

## 2016-11-25 ENCOUNTER — Emergency Department (HOSPITAL_COMMUNITY): Payer: BLUE CROSS/BLUE SHIELD

## 2016-11-25 DIAGNOSIS — Z79899 Other long term (current) drug therapy: Secondary | ICD-10-CM | POA: Insufficient documentation

## 2016-11-25 DIAGNOSIS — R0789 Other chest pain: Secondary | ICD-10-CM | POA: Diagnosis not present

## 2016-11-25 DIAGNOSIS — F17210 Nicotine dependence, cigarettes, uncomplicated: Secondary | ICD-10-CM | POA: Insufficient documentation

## 2016-11-25 DIAGNOSIS — I10 Essential (primary) hypertension: Secondary | ICD-10-CM | POA: Insufficient documentation

## 2016-11-25 DIAGNOSIS — R072 Precordial pain: Secondary | ICD-10-CM | POA: Diagnosis present

## 2016-11-25 LAB — BASIC METABOLIC PANEL
Anion gap: 12 (ref 5–15)
BUN: 14 mg/dL (ref 6–20)
CHLORIDE: 98 mmol/L — AB (ref 101–111)
CO2: 25 mmol/L (ref 22–32)
CREATININE: 1.1 mg/dL (ref 0.61–1.24)
Calcium: 9.9 mg/dL (ref 8.9–10.3)
GFR calc Af Amer: >60 mL/min (ref >60)
GFR calc non Af Amer: >60 mL/min (ref >60)
GLUCOSE: 133 mg/dL — AB (ref 65–99)
Potassium: 3.9 mmol/L (ref 3.5–5.1)
SODIUM: 135 mmol/L (ref 135–145)

## 2016-11-25 LAB — CBC
HCT: 41 % (ref 39.0–52.0)
Hemoglobin: 14.1 g/dL (ref 13.0–17.0)
MCH: 29.7 pg (ref 26.0–34.0)
MCHC: 34.4 g/dL (ref 30.0–36.0)
MCV: 86.3 fL (ref 78.0–100.0)
PLATELETS: 315 10*3/uL (ref 150–400)
RBC: 4.75 MIL/uL (ref 4.22–5.81)
RDW: 13.8 % (ref 11.5–15.5)
WBC: 8.4 10*3/uL (ref 4.0–10.5)

## 2016-11-25 LAB — HEPATIC FUNCTION PANEL
ALK PHOS: 74 U/L (ref 38–126)
ALT: 41 U/L (ref 17–63)
AST: 119 U/L — ABNORMAL HIGH (ref 15–41)
Albumin: 4.7 g/dL (ref 3.5–5.0)
BILIRUBIN DIRECT: 0.3 mg/dL (ref 0.1–0.5)
BILIRUBIN INDIRECT: 1.2 mg/dL — AB (ref 0.3–0.9)
BILIRUBIN TOTAL: 1.5 mg/dL — AB (ref 0.3–1.2)
TOTAL PROTEIN: 8.8 g/dL — AB (ref 6.5–8.1)

## 2016-11-25 LAB — I-STAT TROPONIN, ED: Troponin i, poc: 0 ng/mL (ref 0.00–0.08)

## 2016-11-25 MED ORDER — TRAMADOL HCL 50 MG PO TABS: 50.0000 mg | ORAL_TABLET | Freq: Four times a day (QID) | ORAL | 0 refills | Status: DC | PRN

## 2016-11-25 MED ORDER — HYDROCODONE-ACETAMINOPHEN 5-325 MG PO TABS
1.0000 | ORAL_TABLET | Freq: Once | ORAL | Status: AC
  Administered 2016-11-25: 1 via ORAL
  Filled 2016-11-25: qty 1

## 2016-11-25 MED ORDER — LISINOPRIL 20 MG PO TABS: 20.0000 mg | ORAL_TABLET | Freq: Every day | ORAL | 0 refills | Status: DC

## 2016-11-25 NOTE — ED Triage Notes (Signed)
Patient c/o left arm and left side chest pain that has been constant about 3 weeks.  Patient also c/o dizziness that has been going on for about 6 weeks after he past out at [employer] parade.

## 2016-11-25 NOTE — Discharge Instructions (Signed)
Follow up with a family md in 1-2 weeks °

## 2016-11-25 NOTE — ED Provider Notes (Signed)
[facility] DEPT Provider Note   CSN: [id number] Arrival date & time: 11/25/16  0849     History   Chief Complaint Chief Complaint  Patient presents with  . Chest Pain  . Arm Pain  . Dizziness    HPI Tony Robertson is a 45 y.o. male.  Patient complains of left-sided chest pain and shoulder pain for number weeks worse with movement. Patient does not see his primary care doctor   The history is provided by the patient. No language interpreter was used.  Chest Pain   This is a new problem. The current episode started more than 2 days ago. The problem occurs constantly. The problem has not changed since onset.The pain is associated with movement. The pain is present in the substernal region. The pain is at a severity of 5/10. The quality of the pain is described as sharp. The pain does not radiate. Associated symptoms include dizziness. Pertinent negatives include no abdominal pain, no back pain, no cough and no headaches.  Pertinent negatives for past medical history include no seizures.  Arm Pain  Associated symptoms include chest pain. Pertinent negatives include no abdominal pain and no headaches.  Dizziness  Associated symptoms: chest pain   Associated symptoms: no diarrhea and no headaches     Past Medical History:  Diagnosis Date  . Anemia   . High cholesterol   . Hypertension   . Tobacco abuse     Patient Active Problem List   Diagnosis Date Noted  . Routine general medical examination at a health care facility 11/02/2012  . Other abnormal glucose 11/02/2012  . Essential hypertension, benign 11/02/2012  . Shifting sleep-work schedule, affecting sleep 04/16/2011  . Hypertriglyceridemia 03/26/2011  . Tobacco abuse 03/26/2011  . SLEEP APNEA 03/12/2011    History reviewed. No pertinent surgical history.     Home Medications    Prior to Admission medications   Medication Sig Start Date End Date Taking? Authorizing Provider  lisinopril  (PRINIVIL,ZESTRIL) 20 MG tablet Take 1 tablet (20 mg total) by mouth daily. 11/25/16   Milton Ferguson, MD  traMADol (ULTRAM) 50 MG tablet Take 1 tablet (50 mg total) by mouth every 6 (six) hours as needed. 11/25/16   Milton Ferguson, MD    Family History Family History  Problem Relation Age of Onset  . Hyperlipidemia Father   . Heart disease Father   . Hypertension Father   . Rheum arthritis Mother   . Hypertension Mother   . Hypertension Sister   . Hypertension Other   . Cancer Maternal Aunt   . Cancer Maternal Grandmother   . Asthma Cousin     Social History Social History  Substance Use Topics  . Smoking status: Current Every Day Smoker    Packs/day: 0.50    Years: 20.00    Types: Cigarettes  . Smokeless tobacco: Never Used  . Alcohol use 6.0 oz/week    5 Cans of beer, 5 Shots of liquor per week     Comment: 2-3 beers a day     Allergies   Penicillins   Review of Systems Review of Systems  Constitutional: Negative for appetite change and fatigue.  HENT: Negative for congestion, ear discharge and sinus pressure.   Eyes: Negative for discharge.  Respiratory: Negative for cough.   Cardiovascular: Positive for chest pain.  Gastrointestinal: Negative for abdominal pain and diarrhea.  Genitourinary: Negative for frequency and hematuria.  Musculoskeletal: Negative for back pain.  Skin: Negative for rash.  Neurological:  Positive for dizziness. Negative for seizures and headaches.  Psychiatric/Behavioral: Negative for hallucinations.     Physical Exam Updated Vital Signs BP (!) 136/108   Pulse 93   Temp 98.2 F (36.8 C) (Oral)   Resp 12   Ht 5\' 11"  (1.803 m)   Wt 202 lb (91.6 kg)   SpO2 100%   BMI 28.17 kg/m   Physical Exam  Constitutional: He is oriented to person, place, and time. He appears well-developed.  HENT:  Head: Normocephalic.  Eyes: Conjunctivae and EOM are normal. No scleral icterus.  Neck: Neck supple. No thyromegaly present.    Cardiovascular: Normal rate and regular rhythm.  Exam reveals no gallop and no friction rub.   No murmur heard. Pulmonary/Chest: No stridor. He has no wheezes. He has no rales. He exhibits tenderness.  Patient has tenderness to left anterior chest  Abdominal: He exhibits no distension. There is no tenderness. There is no rebound.  Musculoskeletal: Normal range of motion. He exhibits no edema.  Patient has tenderness to left shoulder  Lymphadenopathy:    He has no cervical adenopathy.  Neurological: He is oriented to person, place, and time. He exhibits normal muscle tone. Coordination normal.  Skin: No rash noted. No erythema.  Psychiatric: He has a normal mood and affect. His behavior is normal.     ED Treatments / Results  Labs (all labs ordered are listed, but only abnormal results are displayed) Labs Reviewed  BASIC METABOLIC PANEL - Abnormal; Notable for the following:       Result Value   Chloride 98 (*)    Glucose, Bld 133 (*)    All other components within normal limits  HEPATIC FUNCTION PANEL - Abnormal; Notable for the following:    Total Protein 8.8 (*)    AST 119 (*)    Total Bilirubin 1.5 (*)    Indirect Bilirubin 1.2 (*)    All other components within normal limits  CBC  I-STAT TROPOININ, ED    EKG  EKG Interpretation None       Radiology Dg Chest 2 View  Result Date: 11/25/2016 CLINICAL DATA:  Left chest and arm pain for 3 weeks. Dizziness for 6 weeks. EXAM: CHEST  2 VIEW COMPARISON:  None. FINDINGS: The heart size and mediastinal contours are within normal limits. Both lungs are clear. The visualized skeletal structures are unremarkable. IMPRESSION: No active cardiopulmonary disease. Electronically Signed   By: Earle Gell M.D.   On: 11/25/2016 10:06    Procedures Procedures (including critical care time)  Medications Ordered in ED Medications  HYDROcodone-acetaminophen (NORCO/VICODIN) 5-325 MG per tablet 1 tablet (1 tablet Oral Given 11/25/16  1012)     Initial Impression / Assessment and Plan / ED Course  I have reviewed the triage vital signs and the nursing notes.  Pertinent labs & imaging results that were available during my care of the patient were reviewed by me and considered in my medical decision making (see chart for details).  Clinical Course     Patient with noncardiac chest pain. Most likely related to chest wall. Patient also has hypertension. He will be put on lisinopril and Ultram and follow-up with PCP in the next couple weeks  Final Clinical Impressions(s) / ED Diagnoses   Final diagnoses:  None    New Prescriptions New Prescriptions   LISINOPRIL (PRINIVIL,ZESTRIL) 20 MG TABLET    Take 1 tablet (20 mg total) by mouth daily.   TRAMADOL (ULTRAM) 50 MG TABLET  Take 1 tablet (50 mg total) by mouth every 6 (six) hours as needed.     Milton Ferguson, MD 11/25/16 [phone]

## 2017-06-08 ENCOUNTER — Encounter (HOSPITAL_COMMUNITY): Payer: Self-pay | Admitting: Emergency Medicine

## 2017-06-08 ENCOUNTER — Emergency Department (HOSPITAL_COMMUNITY): Payer: BLUE CROSS/BLUE SHIELD

## 2017-06-08 ENCOUNTER — Emergency Department (HOSPITAL_COMMUNITY)
Admission: EM | Admit: 2017-06-08 | Discharge: 2017-06-09 | Disposition: A | Discharge status: Home / Self Care | Payer: BLUE CROSS/BLUE SHIELD | Attending: Emergency Medicine | Admitting: Emergency Medicine

## 2017-06-08 DIAGNOSIS — M79605 Pain in left leg: Secondary | ICD-10-CM | POA: Insufficient documentation

## 2017-06-08 DIAGNOSIS — F17210 Nicotine dependence, cigarettes, uncomplicated: Secondary | ICD-10-CM | POA: Insufficient documentation

## 2017-06-08 DIAGNOSIS — M7989 Other specified soft tissue disorders: Secondary | ICD-10-CM

## 2017-06-08 DIAGNOSIS — I10 Essential (primary) hypertension: Secondary | ICD-10-CM | POA: Insufficient documentation

## 2017-06-08 DIAGNOSIS — R0989 Other specified symptoms and signs involving the circulatory and respiratory systems: Secondary | ICD-10-CM

## 2017-06-08 DIAGNOSIS — Z79899 Other long term (current) drug therapy: Secondary | ICD-10-CM | POA: Insufficient documentation

## 2017-06-08 NOTE — ED Triage Notes (Signed)
Pt reports having swelling in left knee for the last 3 weeks. Pt denies any injury to knee.

## 2017-06-09 ENCOUNTER — Ambulatory Visit (HOSPITAL_COMMUNITY)
Admission: RE | Admit: 2017-06-09 | Discharge: 2017-06-09 | Disposition: A | Discharge status: Home / Self Care | Payer: Self-pay | Source: Ambulatory Visit | Attending: Physician Assistant | Admitting: Physician Assistant

## 2017-06-09 DIAGNOSIS — M7989 Other specified soft tissue disorders: Secondary | ICD-10-CM | POA: Insufficient documentation

## 2017-06-09 DIAGNOSIS — M79605 Pain in left leg: Secondary | ICD-10-CM | POA: Insufficient documentation

## 2017-06-09 DIAGNOSIS — M79609 Pain in unspecified limb: Secondary | ICD-10-CM

## 2017-06-09 MED ORDER — ENOXAPARIN SODIUM 100 MG/ML ~~LOC~~ SOLN
1.0000 mg/kg | Freq: Once | SUBCUTANEOUS | Status: AC
  Administered 2017-06-09: 90 mg via SUBCUTANEOUS
  Filled 2017-06-09: qty 0.9

## 2017-06-09 NOTE — ED Provider Notes (Signed)
[facility] DEPT Provider Note   CSN: [id number] Arrival date & time: 06/08/17  1951     History   Chief Complaint Chief Complaint  Patient presents with  . Joint Swelling    HPI Tony Robertson is a 46 y.o. male who presents today with swelling in the left knee for approximately 3 weeks. He denies injury around the time of onset. No new recent activities. He does not have a history of arthritis in the knee. He denies any recent travels/surgeries. No fevers.  He reports that he sits in a vehicle all day for work.  HPI  Past Medical History:  Diagnosis Date  . Anemia   . High cholesterol   . Hypertension   . Tobacco abuse     Patient Active Problem List   Diagnosis Date Noted  . Routine general medical examination at a health care facility 11/02/2012  . Other abnormal glucose 11/02/2012  . Essential hypertension, benign 11/02/2012  . Shifting sleep-work schedule, affecting sleep 04/16/2011  . Hypertriglyceridemia 03/26/2011  . Tobacco abuse 03/26/2011  . SLEEP APNEA 03/12/2011    History reviewed. No pertinent surgical history.     Home Medications    Prior to Admission medications   Medication Sig Start Date End Date Taking? Authorizing Provider  lisinopril (PRINIVIL,ZESTRIL) 20 MG tablet Take 1 tablet (20 mg total) by mouth daily. 11/25/16   Milton Ferguson, MD  traMADol (ULTRAM) 50 MG tablet Take 1 tablet (50 mg total) by mouth every 6 (six) hours as needed. 11/25/16   Milton Ferguson, MD    Family History Family History  Problem Relation Age of Onset  . Hyperlipidemia Father   . Heart disease Father   . Hypertension Father   . Rheum arthritis Mother   . Hypertension Mother   . Hypertension Sister   . Hypertension Other   . Cancer Maternal Aunt   . Cancer Maternal Grandmother   . Asthma Cousin     Social History Social History  Substance Use Topics  . Smoking status: Current Every Day Smoker    Packs/day: 0.50    Years: 20.00    Types:  Cigarettes  . Smokeless tobacco: Never Used  . Alcohol use 6.0 oz/week    5 Cans of beer, 5 Shots of liquor per week     Comment: 2-3 beers a day     Allergies   Penicillins   Review of Systems Review of Systems  Constitutional: Negative for fatigue and fever.  Musculoskeletal: Positive for arthralgias, joint swelling and myalgias.  Skin: Negative for color change, rash and wound.     Physical Exam Updated Vital Signs BP (!) 135/97 (BP Location: Left Arm)   Pulse 82   Temp 98.2 F (36.8 C) (Oral)   Resp 18   Ht 5\' 11"  (1.803 m)   Wt 91.2 kg (201 lb)   SpO2 99%   BMI 28.03 kg/m   Physical Exam  Constitutional: He appears well-developed and well-nourished.  Cardiovascular: Normal rate and intact distal pulses.   Musculoskeletal:  Left lower leg/calf is tender to palpation. Left leg is swollen when compared to right. Swelling is mostly around proximal calf. No bony tenderness, all compartments are soft to palpation. Left leg has positive Homans sign. Left distal leg is warm and well perfused. Leg is not obviously deformed.  Neurological: No sensory deficit.  Skin: Skin is warm and dry. No rash noted. He is not diaphoretic.  Nursing note and vitals reviewed.    ED  Treatments / Results  Labs (all labs ordered are listed, but only abnormal results are displayed) Labs Reviewed - No data to display  EKG  EKG Interpretation None       Radiology Dg Knee Complete 4 Views Left  Result Date: 06/08/2017 CLINICAL DATA:  Knee swelling x3 weeks without known injury. EXAM: LEFT KNEE - COMPLETE 4+ VIEW COMPARISON:  None. FINDINGS: Indistinct posteroinferior pole of the patella associated with small joint effusion question posttraumatic change with osteolysis or related to degenerative subchondral cystic change due to chondromalacia among some possibilities. No dislocation or acute appearing fracture. Slight femorotibial joint space narrowing. IMPRESSION: Indistinct posterior  inferior pole of the patella on the lateral view with small joint effusion question posttraumatic change versus degenerative subchondral cystic focus second order patellar chondromalacia. Electronically Signed   By: Ashley Royalty M.D.   On: 06/08/2017 22:28    Procedures Procedures (including critical care time)  Medications Ordered in ED Medications  enoxaparin (LOVENOX) injection 90 mg (not administered)     Initial Impression / Assessment and Plan / ED Course  I have reviewed the triage vital signs and the nursing notes.  Pertinent labs & imaging results that were available during my care of the patient were reviewed by me and considered in my medical decision making (see chart for details).    Wendie Chess presents with approximately 3 weeks of left calf pain and left leg swelling consistent with a DVT.  He denies any known trauma or changes to activity.  He does not have a history of DVT or blood clotting problems in the past.  No obvious signs of infection, no trauma. Radiology without acute abnormality.  Patient will be given lovenox and has a LE venous duplex scheduled for tomorrow morning. Patient was given strict return precautions and stated his understanding. Patient stated his understanding of the need to obtain duplex at [facility] tomorrow morning and given work note.    Final Clinical Impressions(s) / ED Diagnoses   Final diagnoses:  Leg swelling  Suspected DVT (deep vein thrombosis)  Pain of left lower extremity    New Prescriptions New Prescriptions   No medications on file     Ollen Gross 06/09/17 2307    Molpus, Jenny Reichmann, MD 06/09/17 2339

## 2017-06-09 NOTE — Discharge Instructions (Addendum)
IMPORTANT PATIENT INSTRUCTIONS:  You have been scheduled for an Outpatient Vascular Study at [facility].   ° °If tomorrow is a Saturday, Sunday or holiday, please go to the [facility] Registration Desk at 8 am tomorrow morning and tell them you are there for a vascular study.  If tomorrow is a weekday (Monday-Friday), please go to [facility] Admitting Department at 8 am and tell them  °you are  °there for a vascular study. ° °

## 2017-06-09 NOTE — Progress Notes (Signed)
VASCULAR LAB PRELIMINARY  PRELIMINARY  PRELIMINARY  PRELIMINARY  Left lower extremity venous duplex completed.    Preliminary report:  No evidence of left lower extremity DVT or superficial thrombosis. There is evidence consistent with a left ruptured Baker's cyst.  Khiree Bukhari, RVS 06/09/2017, 9:28 AM

## 2018-11-15 ENCOUNTER — Other Ambulatory Visit: Payer: Self-pay

## 2018-11-15 ENCOUNTER — Emergency Department (HOSPITAL_COMMUNITY)
Admission: EM | Admit: 2018-11-15 | Discharge: 2018-11-15 | Disposition: A | Discharge status: Home / Self Care | Payer: 59 | Attending: Emergency Medicine | Admitting: Emergency Medicine

## 2018-11-15 ENCOUNTER — Encounter (HOSPITAL_COMMUNITY): Payer: Self-pay | Admitting: Emergency Medicine

## 2018-11-15 DIAGNOSIS — I10 Essential (primary) hypertension: Secondary | ICD-10-CM | POA: Insufficient documentation

## 2018-11-15 DIAGNOSIS — F17210 Nicotine dependence, cigarettes, uncomplicated: Secondary | ICD-10-CM | POA: Insufficient documentation

## 2018-11-15 DIAGNOSIS — R310 Gross hematuria: Secondary | ICD-10-CM

## 2018-11-15 DIAGNOSIS — R300 Dysuria: Secondary | ICD-10-CM | POA: Diagnosis present

## 2018-11-15 LAB — URINALYSIS, ROUTINE W REFLEX MICROSCOPIC
Bacteria, UA: NONE SEEN
Bilirubin Urine: NEGATIVE
GLUCOSE, UA: NEGATIVE mg/dL
Ketones, ur: NEGATIVE mg/dL
Nitrite: NEGATIVE
PROTEIN: NEGATIVE mg/dL
RBC / HPF: >50 RBC/hpf — ABNORMAL HIGH (ref 0–5)
Specific Gravity, Urine: 1.02 (ref 1.005–1.030)
pH: 5 (ref 5.0–8.0)

## 2018-11-15 MED ORDER — CEPHALEXIN 500 MG PO CAPS: 500.0000 mg | ORAL_CAPSULE | Freq: Four times a day (QID) | ORAL | 0 refills | Status: DC

## 2018-11-15 NOTE — Discharge Instructions (Addendum)
Your evaluated in the emergency department for 2 episodes of blood in your urine.  We are putting you on some antibiotics for a possible infection.  It is possible that you passed a stone.  If you have any fever or worsening symptoms please return to the emergency department.  Otherwise follow-up with your primary care doctor.

## 2018-11-15 NOTE — ED Provider Notes (Signed)
[facility] EMERGENCY DEPARTMENT Provider Note   CSN: [id number] Arrival date & time: 11/15/18  7824     History   Chief Complaint Chief Complaint  Patient presents with  . Dysuria    HPI Tony Robertson is a 47 y.o. male.  He presents here today after complaining of 2 episodes yesterday of painful urination with blood.  Said about 11 AM he had to urinate and he passed something that was thick and black followed by bright red blood from his penis.  He urinated again around 9 PM last night and had the same thing happened.  It was associated with pain at the tip of the penis.  There is never any abdominal pain or back pain.  No nausea no vomiting no diarrhea no fevers or chills.  This is never happened before.  He denies any discharge or lesions.  No pain in the testicles.  He denies any concerns for STDs.  No trauma.  The history is provided by the patient.  Dysuria   This is a new problem. The current episode started yesterday. The problem occurs every urination. The problem has been gradually improving. The quality of the pain is described as stabbing. The pain is moderate. There has been no fever. He is sexually active. There is no history of pyelonephritis. Associated symptoms include hematuria. Pertinent negatives include no chills, no vomiting, no discharge, no frequency, no urgency and no flank pain. He has tried nothing for the symptoms. His past medical history does not include kidney stones, recurrent UTIs or catheterization.    Past Medical History:  Diagnosis Date  . Anemia   . High cholesterol   . Hypertension   . Tobacco abuse     Patient Active Problem List   Diagnosis Date Noted  . Routine general medical examination at a health care facility 11/02/2012  . Other abnormal glucose 11/02/2012  . Essential hypertension, benign 11/02/2012  . Shifting sleep-work schedule, affecting sleep 04/16/2011  . Hypertriglyceridemia 03/26/2011  . Tobacco  abuse 03/26/2011  . SLEEP APNEA 03/12/2011    History reviewed. No pertinent surgical history.      Home Medications    Prior to Admission medications   Medication Sig Start Date End Date Taking? Authorizing Provider  lisinopril (PRINIVIL,ZESTRIL) 20 MG tablet Take 1 tablet (20 mg total) by mouth daily. 11/25/16   Milton Ferguson, MD  traMADol (ULTRAM) 50 MG tablet Take 1 tablet (50 mg total) by mouth every 6 (six) hours as needed. 11/25/16   Milton Ferguson, MD    Family History Family History  Problem Relation Age of Onset  . Hyperlipidemia Father   . Heart disease Father   . Hypertension Father   . Rheum arthritis Mother   . Hypertension Mother   . Hypertension Sister   . Hypertension Other   . Cancer Maternal Aunt   . Cancer Maternal Grandmother   . Asthma Cousin     Social History Social History   Tobacco Use  . Smoking status: Current Every Day Smoker    Packs/day: 0.50    Years: 20.00    Pack years: 10.00    Types: Cigarettes  . Smokeless tobacco: Never Used  Substance Use Topics  . Alcohol use: Yes    Alcohol/week: 10.0 standard drinks    Types: 5 Cans of beer, 5 Shots of liquor per week    Comment: 2-3 beers a day  . Drug use: No     Allergies  Penicillins   Review of Systems Review of Systems  Constitutional: Negative for chills and fever.  HENT: Negative for sore throat.   Eyes: Negative for visual disturbance.  Respiratory: Negative for shortness of breath.   Cardiovascular: Negative for chest pain.  Gastrointestinal: Negative for abdominal pain and vomiting.  Genitourinary: Positive for dysuria and hematuria. Negative for flank pain, frequency and urgency.  Musculoskeletal: Negative for back pain.  Skin: Negative for rash.  Neurological: Negative for headaches.     Physical Exam Updated Vital Signs BP (!) 157/107 (BP Location: Right Arm)   Pulse 92   Temp 97.9 F (36.6 C) (Oral)   Resp 18   Ht 5\' 10"  (1.778 m)   Wt 90.7 kg    SpO2 100%   BMI 28.70 kg/m   Physical Exam  Constitutional: He appears well-developed and well-nourished.  HENT:  Head: Normocephalic and atraumatic.  Eyes: Conjunctivae are normal.  Neck: Neck supple.  Cardiovascular: Normal rate and regular rhythm.  No murmur heard. Pulmonary/Chest: Effort normal and breath sounds normal. No respiratory distress.  Abdominal: Soft. There is no tenderness.  Genitourinary: Testes normal and penis normal. Right testis shows no tenderness. Left testis shows no tenderness. Circumcised. No penile erythema or penile tenderness. No discharge found.  Musculoskeletal: He exhibits no edema.  Neurological: He is alert.  Skin: Skin is warm and dry.  Psychiatric: He has a normal mood and affect.  Nursing note and vitals reviewed.    ED Treatments / Results  Labs (all labs ordered are listed, but only abnormal results are displayed) Labs Reviewed  URINALYSIS, ROUTINE W REFLEX MICROSCOPIC - Abnormal; Notable for the following components:      Result Value   Hgb urine dipstick LARGE (*)    Leukocytes, UA TRACE (*)    RBC / HPF >50 (*)    All other components within normal limits  URINE CULTURE    EKG None  Radiology No results found.  Procedures Procedures (including critical care time)  Medications Ordered in ED Medications - No data to display   Initial Impression / Assessment and Plan / ED Course  I have reviewed the triage vital signs and the nursing notes.  Pertinent labs & imaging results that were available during my care of the patient were reviewed by me and considered in my medical decision making (see chart for details).  Clinical Course as of Nov 16 1835  Molli Knock Nov 15, 2018  0938 Patient with 2 reported episodes of frank hematuria that was painful yesterday.  Today having some dark urine but improved symptom is otherwise.  His urinalysis showed greater than 50 reds and 6-10 whites.  Likely passed a bladder stone.  Less likely STD in  the setting of no contacts and improving symptoms.  Will cover with some Keflex.   [MB]    Clinical Course User Index [MB] Hayden Rasmussen, MD     Final Clinical Impressions(s) / ED Diagnoses   Final diagnoses:  Gross hematuria    ED Discharge Orders         Ordered    cephALEXin (KEFLEX) 500 MG capsule  4 times daily     11/15/18 0939           Hayden Rasmussen, MD 11/15/18 1836

## 2018-11-15 NOTE — ED Notes (Signed)
Patient was given a  Urinal to get a Ua, when he void to be tested.

## 2018-11-15 NOTE — ED Triage Notes (Signed)
Pt arrives via POV from home with blood in urine and painful urination. Denies recent new sexual contacts, discharge from penis or fevers. Pt awake, alert, appropriate. VSS.

## 2018-11-16 LAB — URINE CULTURE: Culture: NO GROWTH

## 2019-01-07 ENCOUNTER — Ambulatory Visit (INDEPENDENT_AMBULATORY_CARE_PROVIDER_SITE_OTHER): Payer: 59 | Admitting: Family

## 2019-01-07 ENCOUNTER — Other Ambulatory Visit (INDEPENDENT_AMBULATORY_CARE_PROVIDER_SITE_OTHER): Payer: 59

## 2019-01-07 ENCOUNTER — Encounter: Payer: Self-pay | Admitting: Family

## 2019-01-07 VITALS — BP 150/98 | HR 85 | Temp 97.6°F | Ht 70.0 in | Wt 196.1 lb

## 2019-01-07 DIAGNOSIS — Z23 Encounter for immunization: Secondary | ICD-10-CM | POA: Diagnosis not present

## 2019-01-07 DIAGNOSIS — R079 Chest pain, unspecified: Secondary | ICD-10-CM | POA: Diagnosis not present

## 2019-01-07 DIAGNOSIS — R5383 Other fatigue: Secondary | ICD-10-CM

## 2019-01-07 DIAGNOSIS — Z13220 Encounter for screening for lipoid disorders: Secondary | ICD-10-CM

## 2019-01-07 DIAGNOSIS — I10 Essential (primary) hypertension: Secondary | ICD-10-CM | POA: Diagnosis not present

## 2019-01-07 DIAGNOSIS — R350 Frequency of micturition: Secondary | ICD-10-CM

## 2019-01-07 DIAGNOSIS — Z125 Encounter for screening for malignant neoplasm of prostate: Secondary | ICD-10-CM | POA: Diagnosis not present

## 2019-01-07 DIAGNOSIS — R1084 Generalized abdominal pain: Secondary | ICD-10-CM

## 2019-01-07 DIAGNOSIS — R197 Diarrhea, unspecified: Secondary | ICD-10-CM

## 2019-01-07 DIAGNOSIS — R634 Abnormal weight loss: Secondary | ICD-10-CM

## 2019-01-07 LAB — HEMOGLOBIN A1C: Hgb A1c MFr Bld: 5.6 % (ref 4.6–6.5)

## 2019-01-07 LAB — COMPREHENSIVE METABOLIC PANEL
ALBUMIN: 4.1 g/dL (ref 3.5–5.2)
ALT: 77 U/L — ABNORMAL HIGH (ref 0–53)
AST: 89 U/L — ABNORMAL HIGH (ref 0–37)
Alkaline Phosphatase: 100 U/L (ref 39–117)
BUN: 17 mg/dL (ref 6–23)
CO2: 27 mEq/L (ref 19–32)
Calcium: 9.7 mg/dL (ref 8.4–10.5)
Chloride: 101 mEq/L (ref 96–112)
Creatinine, Ser: 1.05 mg/dL (ref 0.40–1.50)
GFR: 96.71 mL/min (ref >60.00)
Glucose, Bld: 94 mg/dL (ref 70–99)
Potassium: 3.9 mEq/L (ref 3.5–5.1)
Sodium: 139 mEq/L (ref 135–145)
Total Bilirubin: 0.7 mg/dL (ref 0.2–1.2)
Total Protein: 7.3 g/dL (ref 6.0–8.3)

## 2019-01-07 LAB — LIPASE: LIPASE: 60 U/L — AB (ref 11.0–59.0)

## 2019-01-07 LAB — CBC WITH DIFFERENTIAL/PLATELET
BASOS PCT: 1 % (ref 0.0–3.0)
Basophils Absolute: 0.1 10*3/uL (ref 0.0–0.1)
Eosinophils Absolute: 0.1 10*3/uL (ref 0.0–0.7)
Eosinophils Relative: 1.1 % (ref 0.0–5.0)
HCT: 36.4 % — ABNORMAL LOW (ref 39.0–52.0)
Hemoglobin: 12.3 g/dL — ABNORMAL LOW (ref 13.0–17.0)
Lymphocytes Relative: 39.3 % (ref 12.0–46.0)
Lymphs Abs: 2.6 10*3/uL (ref 0.7–4.0)
MCHC: 33.6 g/dL (ref 30.0–36.0)
MCV: 90.4 fl (ref 78.0–100.0)
Monocytes Absolute: 0.6 10*3/uL (ref 0.1–1.0)
Monocytes Relative: 9.1 % (ref 3.0–12.0)
Neutro Abs: 3.2 10*3/uL (ref 1.4–7.7)
Neutrophils Relative %: 49.5 % (ref 43.0–77.0)
Platelets: 274 10*3/uL (ref 150.0–400.0)
RBC: 4.03 Mil/uL — ABNORMAL LOW (ref 4.22–5.81)
RDW: 17.2 % — AB (ref 11.5–15.5)
WBC: 6.5 10*3/uL (ref 4.0–10.5)

## 2019-01-07 LAB — PSA: PSA: 0.95 ng/mL (ref 0.10–4.00)

## 2019-01-07 LAB — LIPID PANEL
Cholesterol: 240 mg/dL — ABNORMAL HIGH (ref 0–200)
HDL: 67 mg/dL (ref >39.00)
Total CHOL/HDL Ratio: 4
Triglycerides: 665 mg/dL — ABNORMAL HIGH (ref 0.0–149.0)

## 2019-01-07 LAB — LDL CHOLESTEROL, DIRECT: Direct LDL: 51 mg/dL

## 2019-01-07 LAB — AMYLASE: Amylase: 56 U/L (ref 27–131)

## 2019-01-07 MED ORDER — AMLODIPINE BESYLATE 5 MG PO TABS: 5.0000 mg | ORAL_TABLET | Freq: Every day | ORAL | 1 refills | Status: DC

## 2019-01-07 NOTE — Patient Instructions (Signed)
Steps to Quit Smoking    Smoking tobacco can be bad for your health. It can also affect almost every organ in your body. Smoking puts you and people around you at risk for many serious long-lasting (chronic) diseases. Quitting smoking is hard, but it is one of the best things that you can do for your health. It is never too late to quit.  What are the benefits of quitting smoking?  When you quit smoking, you lower your risk for getting serious diseases and conditions. They can include:  · Lung cancer or lung disease.  · Heart disease.  · Stroke.  · Heart attack.  · Not being able to have children (infertility).  · Weak bones (osteoporosis) and broken bones (fractures).  If you have coughing, wheezing, and shortness of breath, those symptoms may get better when you quit. You may also get sick less often. If you are pregnant, quitting smoking can help to lower your chances of having a baby of low birth weight.  What can I do to help me quit smoking?  Talk with your doctor about what can help you quit smoking. Some things you can do (strategies) include:  · Quitting smoking totally, instead of slowly cutting back how much you smoke over a period of time.  · Going to in-person counseling. You are more likely to quit if you go to many counseling sessions.  · Using resources and support systems, such as:  ? Online chats with a counselor.  ? Phone quitlines.  ? Printed self-help materials.  ? Support groups or group counseling.  ? Text messaging programs.  ? Mobile phone apps or applications.  · Taking medicines. Some of these medicines may have nicotine in them. If you are pregnant or breastfeeding, do not take any medicines to quit smoking unless your doctor says it is okay. Talk with your doctor about counseling or other things that can help you.  Talk with your doctor about using more than one strategy at the same time, such as taking medicines while you are also going to in-person counseling. This can help make  quitting easier.  What things can I do to make it easier to quit?  Quitting smoking might feel very hard at first, but there is a lot that you can do to make it easier. Take these steps:  · Talk to your family and friends. Ask them to support and encourage you.  · Call phone quitlines, reach out to support groups, or work with a counselor.  · Ask people who smoke to not smoke around you.  · Avoid places that make you want (trigger) to smoke, such as:  ? Bars.  ? Parties.  ? Smoke-break areas at work.  · Spend time with people who do not smoke.  · Lower the stress in your life. Stress can make you want to smoke. Try these things to help your stress:  ? Getting regular exercise.  ? Deep-breathing exercises.  ? Yoga.  ? Meditating.  ? Doing a body scan. To do this, close your eyes, focus on one area of your body at a time from head to toe, and notice which parts of your body are tense. Try to relax the muscles in those areas.  · Download or buy apps on your mobile phone or tablet that can help you stick to your quit plan. There are many free apps, such as QuitGuide from the CDC (Centers for Disease Control and Prevention). You can find more   support from smokefree.gov and other websites.  This information is not intended to replace advice given to you by your health care provider. Make sure you discuss any questions you have with your health care provider.  Document Released: 10/11/2009 Document Revised: 08/12/2016 Document Reviewed: 05/01/2015  Elsevier Interactive Patient Education © 2019 Elsevier Inc.

## 2019-01-07 NOTE — Progress Notes (Signed)
Tony Robertson. is a 48 y.o. male with the following history as recorded in [facility]:  Patient Active Problem List   Diagnosis Date Noted  . Routine general medical examination at a health care facility 11/02/2012  . Other abnormal glucose 11/02/2012  . Essential hypertension, benign 11/02/2012  . Shifting sleep-work schedule, affecting sleep 04/16/2011  . Hypertriglyceridemia 03/26/2011  . Tobacco abuse 03/26/2011  . SLEEP APNEA 03/12/2011    Current Outpatient Medications  Medication Sig Dispense Refill  . amLODipine (NORVASC) 5 MG tablet Take 1 tablet (5 mg total) by mouth daily. 90 tablet 1   No current facility-administered medications for this visit.     Allergies: Penicillins  Past Medical History:  Diagnosis Date  . Anemia   . High cholesterol   . Hypertension   . Tobacco abuse     History reviewed. No pertinent surgical history.  Family History  Problem Relation Age of Onset  . Hyperlipidemia Father   . Heart disease Father   . Hypertension Father   . Rheum arthritis Mother   . Hypertension Mother   . Hypertension Sister   . Hypertension Other   . Cancer Maternal Aunt   . Cancer Maternal Grandmother   . Asthma Cousin     Social History   Tobacco Use  . Smoking status: Current Every Day Smoker    Packs/day: 0.50    Years: 20.00    Pack years: 10.00    Types: Cigarettes  . Smokeless tobacco: Never Used  Substance Use Topics  . Alcohol use: Yes    Alcohol/week: 10.0 standard drinks    Types: 5 Cans of beer, 5 Shots of liquor per week    Comment: 2-3 beers a day    Subjective:  Patient presents as a new patient today; has not been seen here since 2013; history of tobacco abuse, elevated triglycerides; was seen at ER 2 months ago with concerns for blood in urine- suspect to have passed a kidney stone. Notes that has been having increased problems with abdominal pain/ diarrhea x "at least" 2 months- notes he is going to be bathroom ten x per day; Had  colonoscopy in 2012 due to rectal bleeding- due for repeat testing in 2022; Is not surprised that his blood pressure is elevated; FH of elevated blood pressure; agreeable to starting medication; does occasionally have chest pain but denies chest pain on exertion; Denies shortness of breath on exertion, blurred vision or headache.    Objective:  Vitals:   01/07/19 0921  BP: (!) 150/98  Pulse: 85  Temp: 97.6 F (36.4 C)  TempSrc: Oral  SpO2: 99%  Weight: 196 lb 1.9 oz (89 kg)  Height: _0  (1.778 m)    General: Well developed, well nourished, in no acute distress  Skin : Warm and dry.  Head: Normocephalic and atraumatic  Eyes: Sclera and conjunctiva clear; pupils round and reactive to light; extraocular movements intact  Ears: External normal; canals clear; tympanic membranes normal  Oropharynx: Pink, supple. No suspicious lesions  Neck: Supple without thyromegaly, adenopathy  Lungs: Respirations unlabored; clear to auscultation bilaterally without wheeze, rales, rhonchi  CVS exam: normal rate and regular rhythm.  Abdomen: Soft; nontender; nondistended; normoactive bowel sounds; no masses or hepatosplenomegaly  Neurologic: Alert and oriented; speech intact; face symmetrical; moves all extremities well; CNII-XII intact without focal deficit   Assessment:  1. Chest pain, unspecified type   2. Other fatigue   3. Essential hypertension   4. Lipid screening  5. Prostate cancer screening   6. Generalized abdominal pain   7. Diarrhea, unspecified type   8. Abnormal weight loss   9. Urinary frequency     Plan:  1. & 2. Normal EKG; start Amlodipine 5 mg daily; follow-up in 2 weeks; 4. Check lipid panel; 5. Check PSA today; 6. & 7. Update abd/ pelvic CT; check amylase, lipase; may need to consider stool studies; will most likely need to refer back to GI; 9. Check Hgba1c;   Return in about 2 weeks (around 01/21/2019).  Orders Placed This Encounter  Procedures  . CT Abdomen  Pelvis W Contrast    Standing Status:   Future    Standing Expiration Date:   04/07/2020    Order Specific Question:   If indicated for the ordered procedure, I authorize the administration of contrast media per Radiology protocol    Answer:   Yes    Order Specific Question:   Preferred imaging location?    Answer:   [address]    Order Specific Question:   Is Oral Contrast requested for this exam?    Answer:   Yes, Per Radiology protocol    Order Specific Question:   Radiology Contrast Protocol - do NOT remove file path    Answer:   [email or web address]  . Tdap vaccine greater than or equal to 7yo IM  . CBC w/Diff    Standing Status:   Future    Number of Occurrences:   1    Standing Expiration Date:   01/07/2020  . Comp Met (CMET)    Standing Status:   Future    Number of Occurrences:   1    Standing Expiration Date:   01/07/2020  . Lipid panel    Standing Status:   Future    Number of Occurrences:   1    Standing Expiration Date:   01/08/2020  . PSA    Standing Status:   Future    Number of Occurrences:   1    Standing Expiration Date:   01/07/2020  . Amylase    Standing Status:   Future    Number of Occurrences:   1    Standing Expiration Date:   01/07/2020  . Lipase    Standing Status:   Future    Number of Occurrences:   1    Standing Expiration Date:   01/07/2020  . HgB A1c    Standing Status:   Future    Number of Occurrences:   1    Standing Expiration Date:   01/07/2020  . EKG 12-Lead    Requested Prescriptions   Signed Prescriptions Disp Refills  . amLODipine (NORVASC) 5 MG tablet 90 tablet 1    Sig: Take 1 tablet (5 mg total) by mouth daily.

## 2019-01-10 ENCOUNTER — Other Ambulatory Visit: Payer: Self-pay | Admitting: Family

## 2019-01-10 DIAGNOSIS — R1084 Generalized abdominal pain: Secondary | ICD-10-CM

## 2019-01-10 DIAGNOSIS — R634 Abnormal weight loss: Secondary | ICD-10-CM

## 2019-01-10 DIAGNOSIS — E781 Pure hyperglyceridemia: Secondary | ICD-10-CM

## 2019-01-10 MED ORDER — FENOFIBRATE 145 MG PO TABS: 145.0000 mg | ORAL_TABLET | Freq: Every day | ORAL | 3 refills | Status: DC

## 2019-01-19 ENCOUNTER — Ambulatory Visit
Admission: RE | Admit: 2019-01-19 | Discharge: 2019-01-19 | Disposition: A | Discharge status: Home / Self Care | Payer: 59 | Source: Ambulatory Visit | Attending: Family | Admitting: Family

## 2019-01-19 DIAGNOSIS — R1084 Generalized abdominal pain: Secondary | ICD-10-CM

## 2019-01-19 DIAGNOSIS — R634 Abnormal weight loss: Secondary | ICD-10-CM

## 2019-01-19 MED ORDER — IOPAMIDOL (ISOVUE-300) INJECTION 61%
100.0000 mL | Freq: Once | INTRAVENOUS | Status: AC | PRN
  Administered 2019-01-19: 100 mL via INTRAVENOUS

## 2019-01-19 MED ORDER — IOPAMIDOL (ISOVUE-300) INJECTION 61%: 100.0000 mL | Freq: Once | INTRAVENOUS | Status: DC | PRN

## 2019-01-24 ENCOUNTER — Ambulatory Visit (INDEPENDENT_AMBULATORY_CARE_PROVIDER_SITE_OTHER): Payer: 59 | Admitting: Family

## 2019-01-24 ENCOUNTER — Encounter: Payer: Self-pay | Admitting: Gastroenterology

## 2019-01-24 ENCOUNTER — Encounter: Payer: Self-pay | Admitting: Family

## 2019-01-24 VITALS — BP 142/88 | HR 78 | Temp 98.0°F | Ht 70.0 in | Wt 195.1 lb

## 2019-01-24 DIAGNOSIS — K529 Noninfective gastroenteritis and colitis, unspecified: Secondary | ICD-10-CM

## 2019-01-24 DIAGNOSIS — Z720 Tobacco use: Secondary | ICD-10-CM | POA: Diagnosis not present

## 2019-01-24 DIAGNOSIS — E781 Pure hyperglyceridemia: Secondary | ICD-10-CM

## 2019-01-24 DIAGNOSIS — I10 Essential (primary) hypertension: Secondary | ICD-10-CM | POA: Diagnosis not present

## 2019-01-24 MED ORDER — FENOFIBRATE 145 MG PO TABS: 145.0000 mg | ORAL_TABLET | Freq: Every day | ORAL | 1 refills | Status: DC

## 2019-01-24 MED ORDER — AMLODIPINE BESYLATE 10 MG PO TABS: 10.0000 mg | ORAL_TABLET | Freq: Every day | ORAL | 0 refills | Status: DC

## 2019-01-24 NOTE — Progress Notes (Signed)
Tony Robertson. is a 48 y.o. male with the following history as recorded in EpicCare:  Patient Active Problem List   Diagnosis Date Noted  . Routine general medical examination at a health care facility 11/02/2012  . Other abnormal glucose 11/02/2012  . Essential hypertension, benign 11/02/2012  . Shifting sleep-work schedule, affecting sleep 04/16/2011  . Hypertriglyceridemia 03/26/2011  . Tobacco abuse 03/26/2011  . SLEEP APNEA 03/12/2011    Current Outpatient Medications  Medication Sig Dispense Refill  . amLODipine (NORVASC) 10 MG tablet Take 1 tablet (10 mg total) by mouth daily. 90 tablet 0  . fenofibrate (TRICOR) 145 MG tablet Take 1 tablet (145 mg total) by mouth daily. 90 tablet 1   No current facility-administered medications for this visit.     Allergies: Penicillins  Past Medical History:  Diagnosis Date  . Anemia   . High cholesterol   . Hypertension   . Tobacco abuse     History reviewed. No pertinent surgical history.  Family History  Problem Relation Age of Onset  . Hyperlipidemia Father   . Heart disease Father   . Hypertension Father   . Rheum arthritis Mother   . Hypertension Mother   . Hypertension Sister   . Hypertension Other   . Cancer Maternal Aunt   . Cancer Maternal Grandmother   . Asthma Cousin     Social History   Tobacco Use  . Smoking status: Current Every Day Smoker    Packs/day: 0.50    Years: 20.00    Pack years: 10.00    Types: Cigarettes  . Smokeless tobacco: Never Used  Substance Use Topics  . Alcohol use: Yes    Alcohol/week: 10.0 standard drinks    Types: 5 Cans of beer, 5 Shots of liquor per week    Comment: 2-3 beers a day    Subjective:  Patient presents to follow-up on chronic needs:  1) Hypertension- started on Amlodipine 5 mg at last OV; tolerating well; Denies any chest pain, shortness of breath, blurred vision or headache. Does feel that has more energy; 2) Elevated TGs- did not understand he was supposed  to re-start Tricor; referral to lipid panel was updated; 3) Chronic diarrhea " x years"- labs/ CT unremarkable; has had colonoscopy in 2012- Dr. Fuller Plan; 4) Quit smoking after last visit- notes still has to have an occasional cigar;     Objective:  Vitals:   01/24/19 1003  BP: (!) 142/88  Pulse: 78  Temp: 98 F (36.7 C)  TempSrc: Oral  SpO2: 99%  Weight: 195 lb 1.9 oz (88.5 kg)  Height: 5\' 10"  (1.778 m)    General: Well developed, well nourished, in no acute distress  Skin : Warm and dry.  Head: Normocephalic and atraumatic  Eyes: Sclera and conjunctiva clear; pupils round and reactive to light; extraocular movements intact  Lungs: Respirations unlabored; clear to auscultation bilaterally without wheeze, rales, rhonchi  CVS exam: normal rate and regular rhythm.  Neurologic: Alert and oriented; speech intact; face symmetrical; moves all extremities well; CNII-XII intact without focal deficit   Assessment:  1. Essential hypertension, benign   2. Hypertriglyceridemia   3. Chronic diarrhea   4. Tobacco abuse     Plan:  1. Improved but still not at goal; increase Amlodipine to 10 mg daily; follow-up again in 1 month; 2. Patient understands to re-start Tricor 145 mg- re-sent prescription; referral to lipid clinic was updated due to TGs being over 500. 3. Recent labs/ abdominal CT  not remarkable- refer to GI for further evaluation. 4. Congratulated patient on quitting smoking- he needs to quit cigars as well.   Return in about 1 month (around 02/24/2019).  Orders Placed This Encounter  Procedures  . Ambulatory referral to Gastroenterology    Referral Priority:   Routine    Referral Type:   Consultation    Referral Reason:   Specialty Services Required    Number of Visits Requested:   1    Requested Prescriptions   Signed Prescriptions Disp Refills  . amLODipine (NORVASC) 10 MG tablet 90 tablet 0    Sig: Take 1 tablet (10 mg total) by mouth daily.  . fenofibrate (TRICOR) 145  MG tablet 90 tablet 1    Sig: Take 1 tablet (145 mg total) by mouth daily.

## 2019-01-24 NOTE — Progress Notes (Signed)
Reviewed with patient at [facility]; has been referred to GI.

## 2019-01-28 ENCOUNTER — Telehealth: Payer: Self-pay | Admitting: Internal Medicine

## 2019-01-28 NOTE — Telephone Encounter (Signed)
Spoke with pt, new patient appointment scheduled per wait list

## 2019-01-28 NOTE — Telephone Encounter (Signed)
New message     Pt returning a call. Not sure what its regarding

## 2019-02-10 ENCOUNTER — Other Ambulatory Visit (INDEPENDENT_AMBULATORY_CARE_PROVIDER_SITE_OTHER): Payer: 59

## 2019-02-10 ENCOUNTER — Telehealth: Payer: Self-pay

## 2019-02-10 ENCOUNTER — Ambulatory Visit: Payer: 59 | Admitting: Gastroenterology

## 2019-02-10 ENCOUNTER — Encounter (INDEPENDENT_AMBULATORY_CARE_PROVIDER_SITE_OTHER): Payer: Self-pay

## 2019-02-10 ENCOUNTER — Encounter: Payer: Self-pay | Admitting: Gastroenterology

## 2019-02-10 VITALS — BP 120/78 | HR 86 | Ht 70.0 in | Wt 196.2 lb

## 2019-02-10 DIAGNOSIS — R634 Abnormal weight loss: Secondary | ICD-10-CM

## 2019-02-10 DIAGNOSIS — R197 Diarrhea, unspecified: Secondary | ICD-10-CM

## 2019-02-10 DIAGNOSIS — R740 Nonspecific elevation of levels of transaminase and lactic acid dehydrogenase [LDH]: Secondary | ICD-10-CM

## 2019-02-10 DIAGNOSIS — R7401 Elevation of levels of liver transaminase levels: Secondary | ICD-10-CM

## 2019-02-10 DIAGNOSIS — D649 Anemia, unspecified: Secondary | ICD-10-CM

## 2019-02-10 LAB — IGA: IgA: 411 mg/dL — ABNORMAL HIGH (ref 68–378)

## 2019-02-10 LAB — TSH: TSH: 0.35 u[IU]/mL (ref 0.35–4.50)

## 2019-02-10 NOTE — Progress Notes (Signed)
History of Present Illness: This is a 48 year old male referred by [name],* FNP for the evaluation of diarrhea and weight loss.  Patient relates a 37-month history of watery and loose diarrhea occurring 6-7 times per day.  When it first started he had a small amount of bright red blood per rectum otherwise no bleeding noted.  He denies any travel history, new medications or antibiotic usage in the time just prior to onset of symptoms.  He has long-term lactose intolerance and has avoided milk products for years.  He occasionally has nocturnal diarrhea that awakens him from sleep.  His appetite has decreased and he has lost about 10 pounds.  CMP in January showed AST 89 ALT 77.  LFTs from November 2017 showed AST 119 and total bilirubin 1.5.  CMP in January showed hemoglobin 12.3, normal MCV.  He denies abdominal pain, constipation, change in stool caliber, melena, nausea, vomiting, dysphagia, reflux symptoms, chest pain.  Abd/pelvic CT 01/19/2019 IMPRESSION: 1. No acute findings in the abdomen pelvis. 2. Normal pancreas. 3. Normal appendix 4. Mild aortic Atherosclerosis (ICD10-I70.0).  Colonoscopy 03/2011 performed for iron deficiency anemia and hematochezia: Mild ascending colon diverticulosis, small polyp (polypoid mucosa) and internal hemorrhoids    Allergies  Allergen Reactions  . Penicillins Other (See Comments)    Unknown_childhood reaction Has patient had a PCN reaction causing immediate rash, facial/tongue/throat swelling, SOB or lightheadedness with hypotension: unknown Has patient had a PCN reaction causing severe rash involving mucus membranes or skin necrosis: unknown Has patient had a PCN reaction that required hospitalization No Has patient had a PCN reaction occurring within the last 10 years: No If all of the above answers are "NO", then may proceed with Cephalosporin use.    Outpatient Medications Prior to Visit  Medication Sig Dispense Refill  .  amLODipine (NORVASC) 10 MG tablet Take 1 tablet (10 mg total) by mouth daily. 90 tablet 0  . fenofibrate (TRICOR) 145 MG tablet Take 1 tablet (145 mg total) by mouth daily. 90 tablet 1   No facility-administered medications prior to visit.    Past Medical History:  Diagnosis Date  . Anemia   . High cholesterol   . Hypertension   . Tobacco abuse    Past Surgical History:  Procedure Laterality Date  . NO PAST SURGERIES     Social History   Socioeconomic History  . Marital status: Single    Spouse name: Not on file  . Number of children: 1  . Years of education: Not on file  . Highest education level: Not on file  Occupational History  . Occupation: Agricultural consultant: [employer]  . Financial resource strain: Not on file  . Food insecurity:    Worry: Not on file    Inability: Not on file  . Transportation needs:    Medical: Not on file    Non-medical: Not on file  Tobacco Use  . Smoking status: Current Every Day Smoker    Packs/day: 0.50    Years: 20.00    Pack years: 10.00    Types: Cigarettes  . Smokeless tobacco: Never Used  Substance and Sexual Activity  . Alcohol use: Yes    Alcohol/week: 10.0 standard drinks    Types: 5 Cans of beer, 5 Shots of liquor per week    Comment: 2-3 beers a day  . Drug use: No  . Sexual activity: Yes  Lifestyle  . Physical activity:  Days per week: Not on file    Minutes per session: Not on file  . Stress: Not on file  Relationships  . Social connections:    Talks on phone: Not on file    Gets together: Not on file    Attends religious service: Not on file    Active member of club or organization: Not on file    Attends meetings of clubs or organizations: Not on file    Relationship status: Not on file  Other Topics Concern  . Not on file  Social History Narrative   Regular Exercise -  YES         Family History  Problem Relation Age of Onset  . Hyperlipidemia Father   . Heart disease  Father   . Hypertension Father   . Rheum arthritis Mother   . Hypertension Mother   . Hypertension Sister   . Hypertension Other   . Cancer Maternal Aunt   . Cancer Maternal Grandmother   . Asthma Cousin       Review of Systems: Pertinent positive and negative review of systems were noted in the above HPI section. All other review of systems were otherwise negative.    Physical Exam: General: Well developed, well nourished, no acute distress Head: Normocephalic and atraumatic Eyes:  sclerae anicteric, EOMI Ears: Normal auditory acuity Mouth: No deformity or lesions Neck: Supple, no masses or thyromegaly Lungs: Clear throughout to auscultation Heart: Regular rate and rhythm; no murmurs, rubs or bruits Abdomen: Soft, mild LLQ tenderness and non distended. No masses, hepatosplenomegaly or hernias noted. Normal Bowel sounds Rectal: Not done Musculoskeletal: Symmetrical with no gross deformities  Skin: No lesions on visible extremities Pulses:  Normal pulses noted Extremities: No clubbing, cyanosis, edema or deformities noted Neurological: Alert oriented x 4, grossly nonfocal Cervical Nodes:  No significant cervical adenopathy Inguinal Nodes: No significant inguinal adenopathy Psychological:  Alert and cooperative. Normal mood and affect   Assessment and Recommendations:  1. Diarrhea, weight loss.  Rule out infectious etiologies, IBD, microscopic colitis and other etiologies.  TSH, tTG, IgA, fecal elastase, GI pathogen panel. Schedule colonoscopy if no diagnosis established with blood work and stool studies.  Begin Imodium 1-2 po tid as needed for symptom management.  2.  Elevated transaminases.  CT of liver, biliary tree unremarkable.  Repeat LFTs today.  Further evaluation with standard hepatic serologies if LFTs remain elevated.  3. Mild normocytic anemia.  Rule out iron, B12, folate deficiency.  Send Fe, TIBC, ferritin, B12, folate.   cc: [name], [place] [address] [place], [place] [address]

## 2019-02-10 NOTE — Patient Instructions (Addendum)
  If you are age 49 or younger, your body mass index should be between 19-25. Your Body mass index is 28.16 kg/m. If this is out of the aformentioned range listed, please consider follow up with your Primary Care Provider.   Your provider has requested that you go to the basement level for lab work before leaving today. Press "B" on the elevator. The lab is located at the first door on the left as you exit the elevator.  Take Imodium three times daily for diarrhea.  Thank you for choosing me and [facility].  [name]. Dagoberto Ligas., MD., [facility]

## 2019-02-10 NOTE — Telephone Encounter (Signed)
Informed patient to come back in for lab work at his convenience. Patient states he will come by the lab this week.

## 2019-02-10 NOTE — Telephone Encounter (Signed)
-----   Message from Ladene Artist, MD sent at 02/10/2019 12:08 PM EST ----- After further review of his chart we need added to today's blood work.  LFTs  Fe, TIBC, ferritin, B12, folate

## 2019-02-11 LAB — TISSUE TRANSGLUTAMINASE, IGA: (TTG) AB, IGA: 1 U/mL

## 2019-02-17 ENCOUNTER — Ambulatory Visit: Payer: 59 | Admitting: Internal Medicine

## 2019-03-15 ENCOUNTER — Telehealth: Payer: Self-pay

## 2019-03-15 NOTE — Telephone Encounter (Signed)
Covid-19 travel screening questions  Have you traveled in the last 14 days? no If yes where?  Do you now or have you had a fever in the last 14 days? no  Do you have any respiratory symptoms of shortness of breath or cough now or in the last 14 days? no  Do you have any family members or close contacts with diagnosed or suspected Covid-19? No  Patient advised that if the criteria changes we need to hear from him prior to coming for the appt.

## 2019-03-16 ENCOUNTER — Ambulatory Visit: Payer: 59 | Admitting: Gastroenterology

## 2019-03-30 ENCOUNTER — Encounter

## 2019-03-31 ENCOUNTER — Telehealth: Payer: Self-pay

## 2019-03-31 NOTE — Telephone Encounter (Signed)
Left message for patient to return my call to change his appt on 04/14/19 to a zoom visit.

## 2019-04-01 NOTE — Telephone Encounter (Signed)
Left a message for patient to return my call. 

## 2019-04-04 ENCOUNTER — Telehealth: Payer: Self-pay

## 2019-04-04 NOTE — Telephone Encounter (Signed)
Left a message for patient to return my call. 

## 2019-04-04 NOTE — Telephone Encounter (Signed)
   Primary Cardiologist:  Pixie Casino, MD   Patient contacted.  History reviewed.  No symptoms to suggest any unstable cardiac conditions.  Based on discussion, with current pandemic situation, we will be postponing this appointment for Tony Robertson. with a plan for f/u in 6 wks or sooner if feasible/necessary.  If symptoms change, he has been instructed to contact our office.    Crissie Reese, CMA  04/04/2019 2:14 PM         .

## 2019-04-05 NOTE — Telephone Encounter (Signed)
Left a message for patient to return my call. 

## 2019-04-06 NOTE — Telephone Encounter (Signed)
Please arrange evisit with Dr. Debara Pickett, MD only. New patient referral for lipid management. Last lipid panel from January in [place]

## 2019-04-06 NOTE — Telephone Encounter (Signed)
Will route to [facility] to get patient on Dr. Lysbeth Penner next DOD day

## 2019-04-12 ENCOUNTER — Ambulatory Visit: Payer: 59 | Admitting: Internal Medicine

## 2019-04-12 NOTE — Telephone Encounter (Signed)
Left a message for patient to return my call. 

## 2019-04-13 NOTE — Telephone Encounter (Signed)
Patient decided to cancel appt for tomorrow with Dr. Fuller Plan until covid-19 restrictions are lifted and can come into the office.

## 2019-04-14 ENCOUNTER — Ambulatory Visit: Payer: 59 | Admitting: Gastroenterology

## 2019-06-14 ENCOUNTER — Other Ambulatory Visit: Payer: Self-pay | Admitting: Family

## 2019-07-14 ENCOUNTER — Other Ambulatory Visit: Payer: Self-pay | Admitting: Family

## 2019-08-01 ENCOUNTER — Encounter: Payer: Self-pay | Admitting: Family

## 2019-08-01 ENCOUNTER — Ambulatory Visit (INDEPENDENT_AMBULATORY_CARE_PROVIDER_SITE_OTHER): Payer: 59 | Admitting: Family

## 2019-08-01 ENCOUNTER — Other Ambulatory Visit: Payer: Self-pay

## 2019-08-01 VITALS — BP 122/84 | HR 102 | Temp 98.2°F | Ht 70.0 in | Wt 199.6 lb

## 2019-08-01 DIAGNOSIS — I10 Essential (primary) hypertension: Secondary | ICD-10-CM

## 2019-08-01 DIAGNOSIS — R7309 Other abnormal glucose: Secondary | ICD-10-CM

## 2019-08-01 DIAGNOSIS — E781 Pure hyperglyceridemia: Secondary | ICD-10-CM

## 2019-08-01 MED ORDER — AMLODIPINE BESYLATE 10 MG PO TABS: 10.0000 mg | ORAL_TABLET | Freq: Every day | ORAL | 3 refills | Status: DC

## 2019-08-01 MED ORDER — FENOFIBRATE 145 MG PO TABS: 145.0000 mg | ORAL_TABLET | Freq: Every day | ORAL | 3 refills | Status: DC

## 2019-08-01 NOTE — Progress Notes (Signed)
Tony Robertson. is a 48 y.o. male with the following history as recorded in EpicCare:  Patient Active Problem List   Diagnosis Date Noted  . Routine general medical examination at a health care facility 11/02/2012  . Other abnormal glucose 11/02/2012  . Essential hypertension, benign 11/02/2012  . Shifting sleep-work schedule, affecting sleep 04/16/2011  . Hypertriglyceridemia 03/26/2011  . Tobacco abuse 03/26/2011  . SLEEP APNEA 03/12/2011    Current Outpatient Medications  Medication Sig Dispense Refill  . amLODipine (NORVASC) 10 MG tablet Take 1 tablet (10 mg total) by mouth daily. 90 tablet 3  . fenofibrate (TRICOR) 145 MG tablet Take 1 tablet (145 mg total) by mouth daily. 90 tablet 3   No current facility-administered medications for this visit.     Allergies: Penicillins  Past Medical History:  Diagnosis Date  . Anemia   . High cholesterol   . Hypertension   . Tobacco abuse     Past Surgical History:  Procedure Laterality Date  . NO PAST SURGERIES      Family History  Problem Relation Age of Onset  . Hyperlipidemia Father   . Heart disease Father   . Hypertension Father   . Rheum arthritis Mother   . Hypertension Mother   . Hypertension Sister   . Hypertension Other   . Cancer Maternal Aunt   . Cancer Maternal Grandmother   . Asthma Cousin     Social History   Tobacco Use  . Smoking status: Current Every Day Smoker    Packs/day: 0.50    Years: 20.00    Pack years: 10.00    Types: Cigarettes  . Smokeless tobacco: Never Used  Substance Use Topics  . Alcohol use: Yes    Alcohol/week: 10.0 standard drinks    Types: 5 Cans of beer, 5 Shots of liquor per week    Comment: 2-3 beers a day    Subjective:  Follow-up on hypertension and hyperlipidemia; notes that he is "doing very well." Notes that once he got back on his medication, all his GI issues resolved; Denies any chest pain, shortness of breath, blurred vision or headache  Is scheduled to see  the lipid clinic later this month- is not sure he will keep that appointment   Objective:  Vitals:   08/01/19 1502  BP: 122/84  Pulse: (!) 102  Temp: 98.2 F (36.8 C)  TempSrc: Oral  SpO2: 98%  Weight: 199 lb 9.6 oz (90.5 kg)  Height: _0  (1.778 m)    General: Well developed, well nourished, in no acute distress  Skin : Warm and dry.  Head: Normocephalic and atraumatic  Eyes: Sclera and conjunctiva clear; pupils round and reactive to light; extraocular movements intact  Ears: External normal; canals clear; tympanic membranes normal  Oropharynx: Pink, supple. No suspicious lesions  Neck: Supple without thyromegaly, adenopathy  Lungs: Respirations unlabored; clear to auscultation bilaterally without wheeze, rales, rhonchi  CVS exam: normal rate and regular rhythm.  Neurologic: Alert and oriented; speech intact; face symmetrical; moves all extremities well; CNII-XII intact without focal deficit   Assessment:   1. Essential hypertension, benign   2. Hypertriglyceridemia   3. Elevated glucose     Plan:  1. Stable; refill updated;  2. Refill on Tricor; check CMP, lipid panel today; will determine if he should keep planned follow up with lipid clinic. 3. Check Hgba1c today;   No follow-ups on file.  Orders Placed This Encounter  Procedures  . CBC w/Diff  Standing Status:   Future    Standing Expiration Date:   07/31/2020  . Comp Met (CMET)    Standing Status:   Future    Standing Expiration Date:   07/31/2020  . Lipid panel    Standing Status:   Future    Standing Expiration Date:   07/31/2020  . HgB A1c    Standing Status:   Future    Standing Expiration Date:   07/31/2020    Requested Prescriptions   Signed Prescriptions Disp Refills  . amLODipine (NORVASC) 10 MG tablet 90 tablet 3    Sig: Take 1 tablet (10 mg total) by mouth daily.  . fenofibrate (TRICOR) 145 MG tablet 90 tablet 3    Sig: Take 1 tablet (145 mg total) by mouth daily.

## 2019-08-23 ENCOUNTER — Ambulatory Visit: Payer: 59 | Admitting: Internal Medicine

## 2019-10-19 NOTE — Progress Notes (Signed)
Subjective:    Patient ID: Tony Robertson., male    DOB: February 23, 1971, 48 y.o.   MRN: [id number]  HPI The patient is here for an acute visit.   Back pain: His current back pain started a couple of days ago.   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  He states it has gotten larger and is harder.  It is painful.  Typically the pain has been localized today area only, except for the past couple of days.  He is now experiencing pain across his lower back that radiates to both sides.  He denies any pain in his legs, numbness/tingling or leg weakness.  He does have some muscle tightness and spasms.  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  This obviously makes it worse.  He has not tried taking anything for his pain.  He does not like taking medication.    Medications and allergies reviewed with patient and updated if appropriate.  Patient Active Problem List   Diagnosis Date Noted  . Routine general medical examination at a health care facility 11/02/2012  . Other abnormal glucose 11/02/2012  . Essential hypertension, benign 11/02/2012  . Shifting sleep-work schedule, affecting sleep 04/16/2011  . Hypertriglyceridemia 03/26/2011  . Tobacco abuse 03/26/2011  . SLEEP APNEA 03/12/2011    Current Outpatient Medications on File Prior to Visit  Medication Sig Dispense Refill  . amLODipine (NORVASC) 10 MG tablet Take 1 tablet (10 mg total) by mouth daily. 90 tablet 3  . fenofibrate (TRICOR) 145 MG tablet Take 1 tablet (145 mg total) by mouth daily. 90 tablet 3   No current facility-administered medications on file prior to visit.     Past Medical History:  Diagnosis Date  . Anemia   . High cholesterol   . Hypertension   . Tobacco abuse     Past Surgical History:  Procedure Laterality Date  . NO PAST SURGERIES      Social History   Socioeconomic History  . Marital status:  Single    Spouse name: Not on file  . Number of children: 1  . Years of education: Not on file  . Highest education level: Not on file  Occupational History  . Occupation: Agricultural consultant: [employer]  . Financial resource strain: Not on file  . Food insecurity    Worry: Not on file    Inability: Not on file  . Transportation needs    Medical: Not on file    Non-medical: Not on file  Tobacco Use  . Smoking status: Current Every Day Smoker    Packs/day: 0.50    Years: 20.00    Pack years: 10.00    Types: Cigarettes  . Smokeless tobacco: Never Used  Substance and Sexual Activity  . Alcohol use: Yes    Alcohol/week: 10.0 standard drinks    Types: 5 Cans of beer, 5 Shots of liquor per week    Comment: 2-3 beers a day  . Drug use: No  . Sexual activity: Yes  Lifestyle  . Physical activity    Days per week: Not on file    Minutes per session: Not on file  . Stress: Not on file  Relationships  . Social connections    Talks on phone: Not on file  Gets together: Not on file    Attends religious service: Not on file    Active member of club or organization: Not on file    Attends meetings of clubs or organizations: Not on file    Relationship status: Not on file  Other Topics Concern  . Not on file  Social History Narrative   Regular Exercise -  YES          Family History  Problem Relation Age of Onset  . Hyperlipidemia Father   . Heart disease Father   . Hypertension Father   . Rheum arthritis Mother   . Hypertension Mother   . Hypertension Sister   . Hypertension Other   . Cancer Maternal Aunt   . Cancer Maternal Grandmother   . Asthma Cousin     Review of Systems Per HPI    Objective:   Vitals:   10/20/19 0954  BP: (!) 138/100  Pulse: 80  Temp: 98.4 F (36.9 C)  SpO2: 98%   BP Readings from Last 3 Encounters:  10/20/19 (!) 138/100  08/01/19 122/84  02/10/19 120/78   Wt Readings from Last 3 Encounters:   10/20/19 199 lb (90.3 kg)  08/01/19 199 lb 9.6 oz (90.5 kg)  02/10/19 196 lb 4 oz (89 kg)   Body mass index is 28.55 kg/m.   Physical Exam Constitutional:      General: He is not in acute distress.    Appearance: Normal appearance. He is not ill-appearing.  HENT:     Head: Normocephalic and atraumatic.  Musculoskeletal:        General: Tenderness (Across lower back with palpation, slight swelling right lower back-?  Lipoma) present. No deformity.     Right lower leg: No edema.     Left lower leg: No edema.  Skin:    General: Skin is warm and dry.  Neurological:     General: No focal deficit present.     Mental Status: He is alert.     Sensory: No sensory deficit.     Motor: No weakness.     Coordination: Coordination normal.     Gait: Gait normal.            Assessment & Plan:    See Problem List for Assessment and Plan of chronic medical problems.

## 2019-10-20 ENCOUNTER — Ambulatory Visit (INDEPENDENT_AMBULATORY_CARE_PROVIDER_SITE_OTHER): Payer: 59 | Admitting: Internal Medicine

## 2019-10-20 ENCOUNTER — Encounter: Payer: Self-pay | Admitting: Internal Medicine

## 2019-10-20 ENCOUNTER — Other Ambulatory Visit: Payer: Self-pay

## 2019-10-20 VITALS — BP 138/100 | HR 80 | Temp 98.4°F | Ht 70.0 in | Wt 199.0 lb

## 2019-10-20 DIAGNOSIS — G8929 Other chronic pain: Secondary | ICD-10-CM | POA: Insufficient documentation

## 2019-10-20 DIAGNOSIS — M545 Low back pain, unspecified: Secondary | ICD-10-CM | POA: Insufficient documentation

## 2019-10-20 MED ORDER — MELOXICAM 15 MG PO TABS: 15.0000 mg | ORAL_TABLET | Freq: Every day | ORAL | 0 refills | Status: DC

## 2019-10-20 MED ORDER — CYCLOBENZAPRINE HCL 10 MG PO TABS: 10.0000 mg | ORAL_TABLET | Freq: Every day | ORAL | 0 refills | Status: DC

## 2019-10-20 NOTE — Patient Instructions (Signed)
Make an appointment with our sports medicine doctor.    Start light duty at work until you see the sports medicine doctor, dr Tamala Julian.    Take meloxicam ( anti-inflammatory) daily.   Take the muscle relaxer at night ( cyclobenzaprine).  This may make you drowsy    Call with any questions.

## 2019-10-20 NOTE — Assessment & Plan Note (Signed)
Lower back pain without radiculopathy Likely related to repetitive heavy lifting from work Deferred steroid injection Meloxicam 15 mg daily Flexeril at bedtime-advised this may make him drowsy Will give him a note for light duty until he is able to see sports medicine and have this further evaluated Refer to sports medicine for further evaluation and treatment

## 2019-11-03 NOTE — Progress Notes (Signed)
[facility] [place], [place] [address] Phone: [phone] Subjective:   I Tony Robertson am serving as a Education administrator for Dr. Hulan Saas.  I'm seeing this patient by the request  of:  [name], FNP   CC: Low back pain  [id number]  Tony [place]. is a 48 y.o. male coming in with complaint of low back pain. 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 No numbness and tingling noted. No injury noted.   Onset- 4-5 months  Location - right sided lower back  Character- sore  Aggravating factors- flexion Reliving factors-  Therapies tried- muscle relaxer  Severity-5 out of 10   Does not remember any true injury but does have repetitive lifting with his regular job.  Patient's previous work-up including a CT abdomen pelvis done January 19, 2019.  Independently visualized by me showing no significant bony abnormality with very mild osteoarthritic changes.  Past Medical History:  Diagnosis Date  . Anemia   . High cholesterol   . Hypertension   . Tobacco abuse    Past Surgical History:  Procedure Laterality Date  . NO PAST SURGERIES     Social History   Socioeconomic History  . Marital status: Single    Spouse name: Not on file  . Number of children: 1  . Years of education: Not on file  . Highest education level: Not on file  Occupational History  . Occupation: Agricultural consultant: [employer]  . Financial resource strain: Not on file  . Food insecurity    Worry: Not on file    Inability: Not on file  . Transportation needs    Medical: Not on file    Non-medical: Not on file  Tobacco Use  . Smoking status: Current Every Day Smoker    Packs/day: 0.50    Years: 20.00    Pack years: 10.00    Types: Cigarettes  . Smokeless tobacco: Never Used  Substance and Sexual Activity  . Alcohol use: Yes    Alcohol/week: 10.0 standard drinks    Types: 5 Cans of beer, 5 Shots of liquor per week   Comment: 2-3 beers a day  . Drug use: No  . Sexual activity: Yes  Lifestyle  . Physical activity    Days per week: Not on file    Minutes per session: Not on file  . Stress: Not on file  Relationships  . Social Herbalist on phone: Not on file    Gets together: Not on file    Attends religious service: Not on file    Active member of club or organization: Not on file    Attends meetings of clubs or organizations: Not on file    Relationship status: Not on file  Other Topics Concern  . Not on file  Social History Narrative   Regular Exercise -  YES         Allergies  Allergen Reactions  . Penicillins Other (See Comments)    Unknown_childhood reaction Has patient had a PCN reaction causing immediate rash, facial/tongue/throat swelling, SOB or lightheadedness with hypotension: unknown Has patient had a PCN reaction causing severe rash involving mucus membranes or skin necrosis: unknown Has patient had a PCN reaction that required hospitalization No Has patient had a PCN reaction occurring within the last 10 years: No If all of the above answers are "NO", then may proceed with Cephalosporin use.  Family History  Problem Relation Age of Onset  . Hyperlipidemia Father   . Heart disease Father   . Hypertension Father   . Rheum arthritis Mother   . Hypertension Mother   . Hypertension Sister   . Hypertension Other   . Cancer Maternal Aunt   . Cancer Maternal Grandmother   . Asthma Cousin     Current Outpatient Medications (Endocrine & Metabolic):  .  predniSONE (DELTASONE) 50 MG tablet, 1 tablet by mouth daily  Current Outpatient Medications (Cardiovascular):  .  amLODipine (NORVASC) 10 MG tablet, Take 1 tablet (10 mg total) by mouth daily. .  fenofibrate (TRICOR) 145 MG tablet, Take 1 tablet (145 mg total) by mouth daily.   Current Outpatient Medications (Analgesics):  .  meloxicam (MOBIC) 15 MG tablet, Take 1 tablet (15 mg total) by mouth daily.    Current Outpatient Medications (Other):  .  cyclobenzaprine (FLEXERIL) 10 MG tablet, Take 1 tablet (10 mg total) by mouth at bedtime. Marland Kitchen  tiZANidine (ZANAFLEX) 4 MG capsule, 1 tablet at night    Past medical history, social, surgical and family history all reviewed in electronic medical record.  No pertanent information unless stated regarding to the chief complaint.   Review of Systems:  No headache, visual changes, nausea, vomiting, diarrhea, constipation, dizziness, abdominal pain, skin rash, fevers, chills, night sweats, weight loss, swollen lymph nodes, body aches, joint swelling,  chest pain, shortness of breath, mood changes.  Positive muscle aches  Objective  Blood pressure 122/82, pulse 97, height 5\' 10"  (1.778 m), SpO2 97 %.     General: No apparent distress alert and oriented x3 mood and affect normal, dressed appropriately.  HEENT: Pupils equal, extraocular movements intact  Respiratory: Patient's speak in full sentences and does not appear short of breath  Cardiovascular: No lower extremity edema, non tender, no erythema  Skin: Warm dry intact with no signs of infection or rash on extremities or on axial skeleton.  Abdomen: Soft nontender  Neuro: Cranial nerves II through XII are intact, neurovascularly intact in all extremities with 2+ DTRs and 2+ pulses.  Lymph: No lymphadenopathy of posterior or anterior cervical chain or axillae bilaterally.  Gait normal with good balance and coordination.  MSK:  Non tender with full range of motion and good stability and symmetric strength and tone of shoulders, elbows, wrist, hip, knee and ankles bilaterally.  Patient's low back exam shows the patient does have lipomas and appears to have them on the right flank area.  Tender to palpation in this area.  Patient is tender over the right sacroiliac joint in the paraspinal musculature lumbar spine.  Lacks last 5 degrees of extension and 5 degrees of rotation to the right.  Negative straight  leg test, positive Corky Sox.    [id number]; 15 additional minutes spent for Therapeutic exercises as stated in above notes.  This included exercises focusing on stretching, strengthening, with significant focus on eccentric aspects.   Long term goals include an improvement in range of motion, strength, endurance as well as avoiding reinjury. Patient's frequency would include in 1-2 times a day, 3-5 times a week for a duration of 6-12 weeks. Low back exercises that included:  Pelvic tilt/bracing instruction to focus on control of the pelvic girdle and lower abdominal muscles  Glute strengthening exercises, focusing on proper firing of the glutes without engaging the low back muscles Proper stretching techniques for maximum relief for the hamstrings, hip flexors, low back and some rotation where tolerated  Proper technique shown and discussed handout in great detail with ATC.  All questions were discussed and answered.     Impression and Recommendations:     This case required medical decision making of moderate complexity. The above documentation has been reviewed and is accurate and complete Lyndal Pulley, DO       Note: This dictation was prepared with Dragon dictation along with smaller phrase technology. Any transcriptional errors that result from this process are unintentional.

## 2019-11-04 ENCOUNTER — Ambulatory Visit: Payer: 59 | Admitting: Family Medicine

## 2019-11-04 ENCOUNTER — Other Ambulatory Visit: Payer: Self-pay

## 2019-11-04 ENCOUNTER — Encounter: Payer: Self-pay | Admitting: Family Medicine

## 2019-11-04 DIAGNOSIS — M545 Low back pain, unspecified: Secondary | ICD-10-CM

## 2019-11-04 MED ORDER — TIZANIDINE HCL 4 MG PO CAPS: ORAL_CAPSULE | ORAL | 0 refills | Status: DC

## 2019-11-04 MED ORDER — PREDNISONE 50 MG PO TABS: ORAL_TABLET | ORAL | 0 refills | Status: DC

## 2019-11-04 NOTE — Patient Instructions (Addendum)
Exercise 3 times a week Spenco orthotics "total support" See me again in 5 weeks

## 2019-11-05 ENCOUNTER — Encounter: Payer: Self-pay | Admitting: Family Medicine

## 2019-11-05 NOTE — Assessment & Plan Note (Signed)
Patient's low back pain seems to be mechanical, keep patient on light duty at work for extended amount of time, meloxicam given, discussed visiting for muscle relaxer, topical anti-inflammatories.  Discussed home exercises and work with Product/process development scientist.  Worsening symptoms will need to consider x-rays which patient declined today.  If patient continues to have pain we will do x-rays and likely formal physical therapy.  Follow-up again in 4 to 6 weeks

## 2019-11-12 ENCOUNTER — Other Ambulatory Visit: Payer: Self-pay | Admitting: Internal Medicine

## 2019-12-09 ENCOUNTER — Ambulatory Visit: Payer: 59 | Admitting: Family Medicine

## 2019-12-13 ENCOUNTER — Other Ambulatory Visit: Payer: Self-pay | Admitting: Internal Medicine

## 2019-12-16 ENCOUNTER — Ambulatory Visit (INDEPENDENT_AMBULATORY_CARE_PROVIDER_SITE_OTHER): Payer: 59 | Admitting: Family Medicine

## 2019-12-16 ENCOUNTER — Other Ambulatory Visit: Payer: Self-pay

## 2019-12-16 ENCOUNTER — Ambulatory Visit (INDEPENDENT_AMBULATORY_CARE_PROVIDER_SITE_OTHER)
Admission: RE | Admit: 2019-12-16 | Discharge: 2019-12-16 | Disposition: A | Discharge status: Home / Self Care | Payer: 59 | Source: Ambulatory Visit | Attending: Family Medicine | Admitting: Family Medicine

## 2019-12-16 VITALS — BP 120/84 | HR 90 | Ht 70.0 in | Wt 199.0 lb

## 2019-12-16 DIAGNOSIS — M545 Low back pain, unspecified: Secondary | ICD-10-CM

## 2019-12-16 MED ORDER — GABAPENTIN 100 MG PO CAPS: 200.0000 mg | ORAL_CAPSULE | Freq: Every day | ORAL | 0 refills | Status: DC

## 2019-12-16 NOTE — Patient Instructions (Addendum)
  [address], [address] [place], [place] [address] Phone [phone]  Gabapentin 200mg  at night Please try to do exercises See me in 5-6 weeks

## 2019-12-16 NOTE — Progress Notes (Signed)
[facility] [place], [place] [address] Phone: [phone] Subjective:   [name], am serving as a scribe for Dr. Hulan Saas. This visit occurred during the SARS-CoV-2 public health emergency.  Safety protocols were in place, including screening questions prior to the visit, additional usage of staff PPE, and extensive cleaning of exam room while observing appropriate contact time as indicated for disinfecting solutions.   I'm seeing this patient by the request  of:    CC: Back pain follow-up  [id number]   11/04/2019 Patient's low back pain seems to be mechanical, keep patient on light duty at work for extended amount of time, meloxicam given, discussed visiting for muscle relaxer, topical anti-inflammatories.  Discussed home exercises and work with Product/process development scientist.  Worsening symptoms will need to consider x-rays which patient declined today.  If patient continues to have pain we will do x-rays and likely formal physical therapy.  Follow-up again in 4 to 6 weeks  Update 12/16/2019 Tony [place]. is a 48 y.o. male coming in with complaint of back pain. Patient states that his pain during the day is better but that he is unable to sleep due to pain. Right sided lower back pain. Denies any radiating symptoms. Is using zanaflex.  Feels the muscle relaxer helps a little bit.  Nothing no severe.  Still nighttime pain seems to be the worst.  Daytime pain though has not improved.  Has been trying to do more lower or light duty work at work.    Past Medical History:  Diagnosis Date  . Anemia   . High cholesterol   . Hypertension   . Tobacco abuse    Past Surgical History:  Procedure Laterality Date  . NO PAST SURGERIES     Social History   Socioeconomic History  . Marital status: Single    Spouse name: Not on file  . Number of children: 1  . Years of education: Not on file  . Highest education level: Not on file   Occupational History  . Occupation: Agricultural consultant: [employer]  Tobacco Use  . Smoking status: Current Every Day Smoker    Packs/day: 0.50    Years: 20.00    Pack years: 10.00    Types: Cigarettes  . Smokeless tobacco: Never Used  Substance and Sexual Activity  . Alcohol use: Yes    Alcohol/week: 10.0 standard drinks    Types: 5 Cans of beer, 5 Shots of liquor per week    Comment: 2-3 beers a day  . Drug use: No  . Sexual activity: Yes  Other Topics Concern  . Not on file  Social History Narrative   Regular Exercise -  YES         Social Determinants of Health   Financial Resource Strain:   . Difficulty of Paying Living Expenses: Not on file  Food Insecurity:   . Worried About Charity fundraiser in the Last Year: Not on file  . Ran Out of Food in the Last Year: Not on file  Transportation Needs:   . Lack of Transportation (Medical): Not on file  . Lack of Transportation (Non-Medical): Not on file  Physical Activity:   . Days of Exercise per Week: Not on file  . Minutes of Exercise per Session: Not on file  Stress:   . Feeling of Stress : Not on file  Social Connections:   . Frequency of Communication with  Friends and Family: Not on file  . Frequency of Social Gatherings with Friends and Family: Not on file  . Attends Religious Services: Not on file  . Active Member of Clubs or Organizations: Not on file  . Attends Archivist Meetings: Not on file  . Marital Status: Not on file   Allergies  Allergen Reactions  . Penicillins Other (See Comments)    Unknown_childhood reaction Has patient had a PCN reaction causing immediate rash, facial/tongue/throat swelling, SOB or lightheadedness with hypotension: unknown Has patient had a PCN reaction causing severe rash involving mucus membranes or skin necrosis: unknown Has patient had a PCN reaction that required hospitalization No Has patient had a PCN reaction occurring within the last 10 years:  No If all of the above answers are "NO", then may proceed with Cephalosporin use.    Family History  Problem Relation Age of Onset  . Hyperlipidemia Father   . Heart disease Father   . Hypertension Father   . Rheum arthritis Mother   . Hypertension Mother   . Hypertension Sister   . Hypertension Other   . Cancer Maternal Aunt   . Cancer Maternal Grandmother   . Asthma Cousin      Current Outpatient Medications (Cardiovascular):  .  amLODipine (NORVASC) 10 MG tablet, Take 1 tablet (10 mg total) by mouth daily. .  fenofibrate (TRICOR) 145 MG tablet, Take 1 tablet (145 mg total) by mouth daily.     Current Outpatient Medications (Other):  .  tiZANidine (ZANAFLEX) 4 MG capsule, 1 tablet at night .  gabapentin (NEURONTIN) 100 MG capsule, Take 2 capsules (200 mg total) by mouth at bedtime.    Past medical history, social, surgical and family history all reviewed in electronic medical record.  No pertanent information unless stated regarding to the chief complaint.   Review of Systems:  No headache, visual changes, nausea, vomiting, diarrhea, constipation, dizziness, abdominal pain, skin rash, fevers, chills, night sweats, weight loss, swollen lymph nodes, body aches, joint swelling, muscle aches, chest pain, shortness of breath, mood changes.   Objective  Blood pressure 120/84, pulse 90, height 5\' 10"  (1.778 m), weight 199 lb (90.3 kg), SpO2 98 %. Systems examined below as of    General: No apparent distress alert and oriented x3 mood and affect normal, dressed appropriately.  HEENT: Pupils equal, extraocular movements intact  Respiratory: Patient's speak in full sentences and does not appear short of breath  Cardiovascular: No lower extremity edema, non tender, no erythema  Skin: Warm dry intact with no signs of infection or rash on extremities or on axial skeleton.  Abdomen: Soft nontender  Neuro: Cranial nerves II through XII are intact, neurovascularly intact in all  extremities with 2+ DTRs and 2+ pulses.  Lymph: No lymphadenopathy of posterior or anterior cervical chain or axillae bilaterally.  Gait normal with good balance and coordination.  MSK:  Non tender with full range of motion and good stability and symmetric strength and tone of shoulders, elbows, wrist, hip, knee and ankles bilaterally.  Low back exam still shows some loss of lordosis.  Some tightness noted lacking last 5 degrees in all planes with range of motion.  Tightness with Corky Sox test negative straight leg test.  Lipoma noted on the right paraspinal side more around L4-S1.   Impression and Recommendations:    . The above documentation has been reviewed and is accurate and complete Lyndal Pulley, DO       Note: This dictation was  prepared with Dragon dictation along with smaller phrase technology. Any transcriptional errors that result from this process are unintentional.

## 2019-12-17 ENCOUNTER — Encounter: Payer: Self-pay | Admitting: Family Medicine

## 2019-12-17 NOTE — Assessment & Plan Note (Signed)
Continues to have the back pain.  X-rays ordered today to further evaluate.  Muscle relaxer does not seem to be helping and change to gabapentin at nighttime.  Patient could continue with some light duty at work but he would like fully released and he will adjust accordingly.  Discussed with patient possible osteopathic manipulation in the long run could be beneficial.  Discussed which activities to avoid.  Follow-up again in 4 to 8 weeks

## 2020-01-20 ENCOUNTER — Ambulatory Visit: Payer: 59 | Admitting: Family Medicine

## 2020-01-24 ENCOUNTER — Ambulatory Visit: Payer: Managed Care, Other (non HMO) | Admitting: Family Medicine

## 2020-01-24 NOTE — Progress Notes (Deleted)
[name] Phone: [phone] Subjective:    I'm seeing this patient by the request  of:  [name], FNP  CC:   [id number]  12/16/2019 Continues to have the back pain.  X-rays ordered today to further evaluate.  Muscle relaxer does not seem to be helping and change to gabapentin at nighttime.  Patient could continue with some light duty at work but he would like fully released and he will adjust accordingly.  Discussed with patient possible osteopathic manipulation in the long run could be beneficial.  Discussed which activities to avoid.  Follow-up again in 4 to 8 weeks  Update 01/24/2020 Tony [place]. is a 49 y.o. male coming in with complaint of back pain. Patient states      Past Medical History:  Diagnosis Date  . Anemia   . High cholesterol   . Hypertension   . Tobacco abuse    Past Surgical History:  Procedure Laterality Date  . NO PAST SURGERIES     Social History   Socioeconomic History  . Marital status: Single    Spouse name: Not on file  . Number of children: 1  . Years of education: Not on file  . Highest education level: Not on file  Occupational History  . Occupation: Agricultural consultant: [employer]  Tobacco Use  . Smoking status: Current Every Day Smoker    Packs/day: 0.50    Years: 20.00    Pack years: 10.00    Types: Cigarettes  . Smokeless tobacco: Never Used  Substance and Sexual Activity  . Alcohol use: Yes    Alcohol/week: 10.0 standard drinks    Types: 5 Cans of beer, 5 Shots of liquor per week    Comment: 2-3 beers a day  . Drug use: No  . Sexual activity: Yes  Other Topics Concern  . Not on file  Social History Narrative   Regular Exercise -  YES         Social Determinants of Health   Financial Resource Strain:   . Difficulty of Paying Living Expenses: Not on file  Food Insecurity:   . Worried About Charity fundraiser  in the Last Year: Not on file  . Ran Out of Food in the Last Year: Not on file  Transportation Needs:   . Lack of Transportation (Medical): Not on file  . Lack of Transportation (Non-Medical): Not on file  Physical Activity:   . Days of Exercise per Week: Not on file  . Minutes of Exercise per Session: Not on file  Stress:   . Feeling of Stress : Not on file  Social Connections:   . Frequency of Communication with Friends and Family: Not on file  . Frequency of Social Gatherings with Friends and Family: Not on file  . Attends Religious Services: Not on file  . Active Member of Clubs or Organizations: Not on file  . Attends Archivist Meetings: Not on file  . Marital Status: Not on file   Allergies  Allergen Reactions  . Penicillins Other (See Comments)    Unknown_childhood reaction Has patient had a PCN reaction causing immediate rash, facial/tongue/throat swelling, SOB or lightheadedness with hypotension: unknown Has patient had a PCN reaction causing severe rash involving mucus membranes or skin necrosis: unknown Has patient had a PCN reaction that required hospitalization No Has patient had a PCN reaction occurring within the last  10 years: No If all of the above answers are "NO", then may proceed with Cephalosporin use.    Family History  Problem Relation Age of Onset  . Hyperlipidemia Father   . Heart disease Father   . Hypertension Father   . Rheum arthritis Mother   . Hypertension Mother   . Hypertension Sister   . Hypertension Other   . Cancer Maternal Aunt   . Cancer Maternal Grandmother   . Asthma Cousin      Current Outpatient Medications (Cardiovascular):  .  amLODipine (NORVASC) 10 MG tablet, Take 1 tablet (10 mg total) by mouth daily. .  fenofibrate (TRICOR) 145 MG tablet, Take 1 tablet (145 mg total) by mouth daily.     Current Outpatient Medications (Other):  .  gabapentin (NEURONTIN) 100 MG capsule, Take 2 capsules (200 mg total) by mouth  at bedtime. Marland Kitchen  tiZANidine (ZANAFLEX) 4 MG capsule, 1 tablet at night   Reviewed prior external information including notes and imaging from  primary care provider As well as notes that were available from care everywhere and other healthcare systems.  Past medical history, social, surgical and family history all reviewed in electronic medical record.  No pertanent information unless stated regarding to the chief complaint.   Review of Systems:  No headache, visual changes, nausea, vomiting, diarrhea, constipation, dizziness, abdominal pain, skin rash, fevers, chills, night sweats, weight loss, swollen lymph nodes, body aches, joint swelling, chest pain, shortness of breath, mood changes. POSITIVE muscle aches  Objective  There were no vitals taken for this visit.   General: No apparent distress alert and oriented x3 mood and affect normal, dressed appropriately.  HEENT: Pupils equal, extraocular movements intact  Respiratory: Patient's speak in full sentences and does not appear short of breath  Cardiovascular: No lower extremity edema, non tender, no erythema  Skin: Warm dry intact with no signs of infection or rash on extremities or on axial skeleton.  Abdomen: Soft nontender  Neuro: Cranial nerves II through XII are intact, neurovascularly intact in all extremities with 2+ DTRs and 2+ pulses.  Lymph: No lymphadenopathy of posterior or anterior cervical chain or axillae bilaterally.  Gait normal with good balance and coordination.  MSK:  Non tender with full range of motion and good stability and symmetric strength and tone of shoulders, elbows, wrist, hip, knee and ankles bilaterally.     Impression and Recommendations:     This case required medical decision making of moderate complexity. The above documentation has been reviewed and is accurate and complete Jacqualin Combes       Note: This dictation was prepared with Dragon dictation along with smaller phrase technology. Any  transcriptional errors that result from this process are unintentional.

## 2020-02-08 ENCOUNTER — Ambulatory Visit: Payer: Managed Care, Other (non HMO) | Attending: Internal Medicine

## 2020-02-08 ENCOUNTER — Other Ambulatory Visit: Payer: Managed Care, Other (non HMO)

## 2020-02-08 DIAGNOSIS — Z20822 Contact with and (suspected) exposure to covid-19: Secondary | ICD-10-CM

## 2020-02-09 LAB — NOVEL CORONAVIRUS, NAA: SARS-CoV-2, NAA: NOT DETECTED

## 2020-08-10 ENCOUNTER — Other Ambulatory Visit: Payer: Self-pay | Admitting: Family

## 2020-08-10 MED ORDER — FENOFIBRATE 145 MG PO TABS: 145.0000 mg | ORAL_TABLET | Freq: Every day | ORAL | 0 refills | Status: DC

## 2020-08-10 MED ORDER — AMLODIPINE BESYLATE 10 MG PO TABS: 10.0000 mg | ORAL_TABLET | Freq: Every day | ORAL | 0 refills | Status: DC

## 2020-08-24 ENCOUNTER — Encounter: Payer: Self-pay | Admitting: Family

## 2020-08-24 ENCOUNTER — Ambulatory Visit (INDEPENDENT_AMBULATORY_CARE_PROVIDER_SITE_OTHER): Payer: 59 | Admitting: Family

## 2020-08-24 ENCOUNTER — Other Ambulatory Visit: Payer: Self-pay

## 2020-08-24 VITALS — BP 130/88 | HR 97 | Temp 97.5°F | Ht 70.0 in | Wt 202.8 lb

## 2020-08-24 DIAGNOSIS — E781 Pure hyperglyceridemia: Secondary | ICD-10-CM | POA: Diagnosis not present

## 2020-08-24 DIAGNOSIS — M545 Low back pain, unspecified: Secondary | ICD-10-CM

## 2020-08-24 DIAGNOSIS — I10 Essential (primary) hypertension: Secondary | ICD-10-CM

## 2020-08-24 MED ORDER — AMLODIPINE BESYLATE 10 MG PO TABS: 10.0000 mg | ORAL_TABLET | Freq: Every day | ORAL | 0 refills | Status: DC

## 2020-08-24 MED ORDER — MELOXICAM 15 MG PO TABS: 15.0000 mg | ORAL_TABLET | Freq: Every day | ORAL | 0 refills | Status: DC

## 2020-08-24 NOTE — Progress Notes (Signed)
Tony Cahalan. is a 49 y.o. male with the following history as recorded in EpicCare:  Patient Active Problem List   Diagnosis Date Noted   Bilateral low back pain without sciatica 10/20/2019   Routine general medical examination at a health care facility 11/02/2012   Other abnormal glucose 11/02/2012   Essential hypertension, benign 11/02/2012   Shifting sleep-work schedule, affecting sleep 04/16/2011   Hypertriglyceridemia 03/26/2011   Tobacco abuse 03/26/2011   SLEEP APNEA 03/12/2011    Current Outpatient Medications  Medication Sig Dispense Refill   amLODipine (NORVASC) 10 MG tablet Take 1 tablet (10 mg total) by mouth daily. 90 tablet 0   fenofibrate (TRICOR) 145 MG tablet Take 1 tablet (145 mg total) by mouth daily. 90 tablet 0   meloxicam (MOBIC) 15 MG tablet Take 1 tablet (15 mg total) by mouth daily. 30 tablet 0   No current facility-administered medications for this visit.    Allergies: Penicillins  Past Medical History:  Diagnosis Date   Anemia    High cholesterol    Hypertension    Tobacco abuse     Past Surgical History:  Procedure Laterality Date   NO PAST SURGERIES      Family History  Problem Relation Age of Onset   Hyperlipidemia Father    Heart disease Father    Hypertension Father    Rheum arthritis Mother    Hypertension Mother    Hypertension Sister    Hypertension Other    Cancer Maternal Aunt    Cancer Maternal Grandmother    Asthma Cousin     Social History   Tobacco Use   Smoking status: Current Every Day Smoker    Packs/day: 0.50    Years: 20.00    Pack years: 10.00    Types: Cigarettes   Smokeless tobacco: Never Used  Substance Use Topics   Alcohol use: Yes    Alcohol/week: 10.0 standard drinks    Types: 5 Cans of beer, 5 Shots of liquor per week    Comment: 2-3 beers a day    Subjective:   Patient has history of chronic back pain; works at [employer] and job involves heavy lifting;  has seen sports medicine provider and manipulation was discussed; cannot tolerate muscle relaxers;     Objective:  Vitals:   08/24/20 1306  BP: 130/88  Pulse: 97  Temp: (!) 97.5 F (36.4 C)  SpO2: 99%  Weight: 202 lb 12.8 oz (92 kg)  Height: '5\' 10"'  (1.778 m)    General: Well developed, well nourished, in no acute distress Head: Normocephalic and atraumatic  Lungs: Respirations unlabored; clear to auscultation bilaterally without wheeze, rales, rhonchi  CVS exam: normal rate and regular rhythm.  Musculoskeletal: No deformities; no active joint inflammation  Extremities: No edema, cyanosis, clubbing  Vessels: Symmetric bilaterally  Neurologic: Alert and oriented; speech intact; face symmetrical; moves all extremities well; CNII-XII intact without focal deficit   Assessment:  1. Essential hypertension, benign   2. Hypertriglyceridemia   3. Bilateral low back pain without sciatica, unspecified chronicity     Plan:  1. Stable; plan to check CBC, CMP at later date; 2. Plan to check lipid panel at later date- he is not fasting today; continue same medications; 3. Refill given on Mobic 15 mg daily; recommended to follow up with sports medicine;  This visit occurred during the SARS-CoV-2 public health emergency.  Safety protocols were in place, including screening questions prior to the visit, additional usage of  staff PPE, and extensive cleaning of exam room while observing appropriate contact time as indicated for disinfecting solutions.     Return for Schedule a follow-up with Dr. Tamala Julian about your back.  Orders Placed This Encounter  Procedures   CBC with Differential/Platelet    Standing Status:   Future    Standing Expiration Date:   08/24/2021   Comp Met (CMET)    Standing Status:   Future    Standing Expiration Date:   08/24/2021   Lipid panel    Standing Status:   Future    Standing Expiration Date:   08/24/2021    Requested Prescriptions   Signed Prescriptions  Disp Refills   meloxicam (MOBIC) 15 MG tablet 30 tablet 0    Sig: Take 1 tablet (15 mg total) by mouth daily.

## 2020-09-29 ENCOUNTER — Other Ambulatory Visit: Payer: Self-pay | Admitting: Family

## 2020-10-09 ENCOUNTER — Other Ambulatory Visit: Payer: Self-pay

## 2020-10-09 ENCOUNTER — Ambulatory Visit (INDEPENDENT_AMBULATORY_CARE_PROVIDER_SITE_OTHER): Payer: 59 | Admitting: Family Medicine

## 2020-10-09 ENCOUNTER — Encounter: Payer: Self-pay | Admitting: Family Medicine

## 2020-10-09 DIAGNOSIS — M545 Low back pain, unspecified: Secondary | ICD-10-CM | POA: Diagnosis not present

## 2020-10-09 MED ORDER — GABAPENTIN 100 MG PO CAPS: 200.0000 mg | ORAL_CAPSULE | Freq: Every day | ORAL | 3 refills | Status: DC

## 2020-10-09 MED ORDER — METHYLPREDNISOLONE ACETATE 80 MG/ML IJ SUSP
80.0000 mg | Freq: Once | INTRAMUSCULAR | Status: AC
  Administered 2020-10-09: 80 mg via INTRAMUSCULAR

## 2020-10-09 MED ORDER — PREDNISONE 50 MG PO TABS: ORAL_TABLET | ORAL | 0 refills | Status: DC

## 2020-10-09 MED ORDER — KETOROLAC TROMETHAMINE 60 MG/2ML IM SOLN
60.0000 mg | Freq: Once | INTRAMUSCULAR | Status: AC
  Administered 2020-10-09: 60 mg via INTRAMUSCULAR

## 2020-10-09 NOTE — Assessment & Plan Note (Signed)
Bilateral low back pain with very mild radicular symptoms now.  Patient on exam today shows significant tightness but no true radicular symptoms.  Prednisone and gabapentin given again, patient did not tolerate the meloxicam well so we will not bridge it.  Warned of potential side effects of these medications as well.  Patient does have some mild low medical literacy so we explained multiple times.  Also explained to girlfriend who came in for the Toradol and Depo-Medrol injections.  Patient given some mild restrictions at work over the course the next 4 weeks.  If continuing to have pain I do think MRI and laboratory work-up would be beneficial.  Patient will follow up with me again 4 weeks

## 2020-10-09 NOTE — Progress Notes (Signed)
[place] [address] [place] Phone: [phone] Subjective:   I Tony Robertson am serving as a Education administrator for Dr. Hulan Saas.  This visit occurred during the SARS-CoV-2 public health emergency.  Safety protocols were in place, including screening questions prior to the visit, additional usage of staff PPE, and extensive cleaning of exam room while observing appropriate contact time as indicated for disinfecting solutions.   I'm seeing this patient by the request  of:  [name], FNP  CC: Low back pain  [id number]   12/16/2019 Continues to have the back pain.  X-rays ordered today to further evaluate.  Muscle relaxer does not seem to be helping and change to gabapentin at nighttime.  Patient could continue with some light duty at work but he would like fully released and he will adjust accordingly.  Discussed with patient possible osteopathic manipulation in the long run could be beneficial.  Discussed which activities to avoid.  Follow-up again in 4 to 8 weeks  Update 10/09/2020 Tony Robertson. is a 49 y.o. male coming in with complaint of low back pain. Patient states his back pain has gotten worse. Mid back. 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 States the pain radiates to his legs and neck. ADLs causing pain. Pain at night. 8/10 at its worse.  Patient states that the meloxicam made him feel woozy.  Patient was last seen in December and was lost to follow-up with noncompliance.  Patient did have x-rays last time that did show multilevel degenerative disc disease of the lumbar spine.     Past Medical History:  Diagnosis Date   Anemia    High cholesterol    Hypertension    Tobacco abuse    Past Surgical History:  Procedure Laterality Date   NO PAST SURGERIES     Social History   Socioeconomic History   Marital status: Single    Spouse name: Not on file   Number of children: 1   Years of education: Not  on file   Highest education level: Not on file  Occupational History   Occupation: Agricultural consultant: [employer]  Tobacco Use   Smoking status: Current Every Day Smoker    Packs/day: 0.50    Years: 20.00    Pack years: 10.00    Types: Cigarettes   Smokeless tobacco: Never Used  Vaping Use   Vaping Use: Never used  Substance and Sexual Activity   Alcohol use: Yes    Alcohol/week: 10.0 standard drinks    Types: 5 Cans of beer, 5 Shots of liquor per week    Comment: 2-3 beers a day   Drug use: No   Sexual activity: Yes  Other Topics Concern   Not on file  Social History Narrative   Regular Exercise -  YES         Social Determinants of Health   Financial Resource Strain:    Difficulty of Paying Living Expenses: Not on file  Food Insecurity:    Worried About Charity fundraiser in the Last Year: Not on file   [employer] of Food in the Last Year: Not on file  Transportation Needs:    Lack of Transportation (Medical): Not on file   Lack of Transportation (Non-Medical): Not on file  Physical Activity:    Days of Exercise per Week: Not on file   Minutes of Exercise per Session: Not on file  Stress:  Feeling of Stress : Not on file  Social Connections:    Frequency of Communication with Friends and Family: Not on file   Frequency of Social Gatherings with Friends and Family: Not on file   Attends Religious Services: Not on file   Active Member of Clubs or Organizations: Not on file   Attends Archivist Meetings: Not on file   Marital Status: Not on file   Allergies  Allergen Reactions   Penicillins Other (See Comments)    Unknown_childhood reaction Has patient had a PCN reaction causing immediate rash, facial/tongue/throat swelling, SOB or lightheadedness with hypotension: unknown Has patient had a PCN reaction causing severe rash involving mucus membranes or skin necrosis: unknown Has patient had a PCN reaction that  required hospitalization No Has patient had a PCN reaction occurring within the last 10 years: No If all of the above answers are "NO", then may proceed with Cephalosporin use.    Family History  Problem Relation Age of Onset   Hyperlipidemia Father    Heart disease Father    Hypertension Father    Rheum arthritis Mother    Hypertension Mother    Hypertension Sister    Hypertension Other    Cancer Maternal Aunt    Cancer Maternal Grandmother    Asthma Cousin     Current Outpatient Medications (Endocrine & Metabolic):    predniSONE (DELTASONE) 50 MG tablet, 1 tablet by mouth daily  Current Outpatient Medications (Cardiovascular):    amLODipine (NORVASC) 10 MG tablet, Take 1 tablet (10 mg total) by mouth daily.   fenofibrate (TRICOR) 145 MG tablet, Take 1 tablet (145 mg total) by mouth daily.   Current Outpatient Medications (Analgesics):    meloxicam (MOBIC) 15 MG tablet, TAKE 1 TABLET BY MOUTH EVERY DAY   Current Outpatient Medications (Other):    gabapentin (NEURONTIN) 100 MG capsule, Take 2 capsules (200 mg total) by mouth at bedtime.   Reviewed prior external information including notes and imaging from  primary care provider As well as notes that were available from care everywhere and other healthcare systems.  Past medical history, social, surgical and family history all reviewed in electronic medical record.  No pertanent information unless stated regarding to the chief complaint.   Review of Systems:  No headache, visual changes, nausea, vomiting, diarrhea, constipation, dizziness, abdominal pain, skin rash, fevers, chills, night sweats, weight loss, swollen lymph nodes, body aches, joint swelling, chest pain, shortness of breath, mood changes. POSITIVE muscle aches  Objective  Blood pressure 100/80, pulse (!) 103, height 5\' 10"  (1.778 m), weight 200 lb (90.7 kg), SpO2 97 %.   General: No apparent distress alert and oriented x3 mood and affect  normal, dressed appropriately.  HEENT: Pupils equal, extraocular movements intact  Respiratory: Patient's speak in full sentences and does not appear short of breath  Cardiovascular: No lower extremity edema, non tender, no erythema  Neuro: Cranial nerves II through XII are intact, neurovascularly intact in all extremities with 2+ DTRs and 2+ pulses.  Gait normal with good balance and coordination.  MSK:  Non tender with full range of motion and good stability and symmetric strength and tone of shoulders, elbows, wrist, hip, knee and ankles bilaterally.  Low back exam shows significant tightness in the paraspinal musculature.  Tightness with straight leg test.  Tightness with FABER test.  Negative straight leg test.  Does have limited range of motion secondary to voluntary guarding.   Impression and Recommendations:     The  above documentation has been reviewed and is accurate and complete Tony Pulley, DO

## 2020-10-09 NOTE — Patient Instructions (Addendum)
Good to see you Prednisone daily for 5 days starting tomorrow Gabapentin at night  Injections today Back exercises Only 40 lbs unassisted lifting for the next month starting Thursday. Out of work until Thursday. See me again in 4 weeks if still having trouble MRI and labs

## 2020-11-04 ENCOUNTER — Other Ambulatory Visit: Payer: Self-pay | Admitting: Family

## 2020-11-05 NOTE — Progress Notes (Deleted)
[place] Phone: [phone] Subjective:    I'm seeing this patient by the request  of:  [name], FNP  CC:   [id number]   10/09/2020 Bilateral low back pain with very mild radicular symptoms now.  Patient on exam today shows significant tightness but no true radicular symptoms.  Prednisone and gabapentin given again, patient did not tolerate the meloxicam well so we will not bridge it.  Warned of potential side effects of these medications as well.  Patient does have some mild low medical literacy so we explained multiple times.  Also explained to girlfriend who came in for the Toradol and Depo-Medrol injections.  Patient given some mild restrictions at work over the course the next 4 weeks.  If continuing to have pain I do think MRI and laboratory work-up would be beneficial.  Patient will follow up with me again 4 weeks  Update 11/07/2020 Tony Robertson. is a 49 y.o. male coming in with complaint of low back pain. Patient states        Past Medical History:  Diagnosis Date  . Anemia   . High cholesterol   . Hypertension   . Tobacco abuse    Past Surgical History:  Procedure Laterality Date  . NO PAST SURGERIES     Social History   Socioeconomic History  . Marital status: Single    Spouse name: Not on file  . Number of children: 1  . Years of education: Not on file  . Highest education level: Not on file  Occupational History  . Occupation: Agricultural consultant: [employer]  Tobacco Use  . Smoking status: Current Every Day Smoker    Packs/day: 0.50    Years: 20.00    Pack years: 10.00    Types: Cigarettes  . Smokeless tobacco: Never Used  Vaping Use  . Vaping Use: Never used  Substance and Sexual Activity  . Alcohol use: Yes    Alcohol/week: 10.0 standard drinks    Types: 5 Cans of beer, 5 Shots of liquor per week    Comment: 2-3 beers a day  . Drug use:  No  . Sexual activity: Yes  Other Topics Concern  . Not on file  Social History Narrative   Regular Exercise -  YES         Social Determinants of Health   Financial Resource Strain:   . Difficulty of Paying Living Expenses: Not on file  Food Insecurity:   . Worried About Charity fundraiser in the Last Year: Not on file  . Ran Out of Food in the Last Year: Not on file  Transportation Needs:   . Lack of Transportation (Medical): Not on file  . Lack of Transportation (Non-Medical): Not on file  Physical Activity:   . Days of Exercise per Week: Not on file  . Minutes of Exercise per Session: Not on file  Stress:   . Feeling of Stress : Not on file  Social Connections:   . Frequency of Communication with Friends and Family: Not on file  . Frequency of Social Gatherings with Friends and Family: Not on file  . Attends Religious Services: Not on file  . Active Member of Clubs or Organizations: Not on file  . Attends Archivist Meetings: Not on file  . Marital Status: Not on file   Allergies  Allergen Reactions  . Penicillins Other (See Comments)  Unknown_childhood reaction Has patient had a PCN reaction causing immediate rash, facial/tongue/throat swelling, SOB or lightheadedness with hypotension: unknown Has patient had a PCN reaction causing severe rash involving mucus membranes or skin necrosis: unknown Has patient had a PCN reaction that required hospitalization No Has patient had a PCN reaction occurring within the last 10 years: No If all of the above answers are "NO", then may proceed with Cephalosporin use.    Family History  Problem Relation Age of Onset  . Hyperlipidemia Father   . Heart disease Father   . Hypertension Father   . Rheum arthritis Mother   . Hypertension Mother   . Hypertension Sister   . Hypertension Other   . Cancer Maternal Aunt   . Cancer Maternal Grandmother   . Asthma Cousin     Current Outpatient Medications (Endocrine &  Metabolic):  .  predniSONE (DELTASONE) 50 MG tablet, 1 tablet by mouth daily  Current Outpatient Medications (Cardiovascular):  .  fenofibrate (TRICOR) 145 MG tablet, TAKE 1 TABLET BY MOUTH EVERY DAY .  amLODipine (NORVASC) 10 MG tablet, Take 1 tablet (10 mg total) by mouth daily.   Current Outpatient Medications (Analgesics):  .  meloxicam (MOBIC) 15 MG tablet, TAKE 1 TABLET BY MOUTH EVERY DAY   Current Outpatient Medications (Other):  .  gabapentin (NEURONTIN) 100 MG capsule, Take 2 capsules (200 mg total) by mouth at bedtime.   Reviewed prior external information including notes and imaging from  primary care provider As well as notes that were available from care everywhere and other healthcare systems.  Past medical history, social, surgical and family history all reviewed in electronic medical record.  No pertanent information unless stated regarding to the chief complaint.   Review of Systems:  No headache, visual changes, nausea, vomiting, diarrhea, constipation, dizziness, abdominal pain, skin rash, fevers, chills, night sweats, weight loss, swollen lymph nodes, body aches, joint swelling, chest pain, shortness of breath, mood changes. POSITIVE muscle aches  Objective  There were no vitals taken for this visit.   General: No apparent distress alert and oriented x3 mood and affect normal, dressed appropriately.  HEENT: Pupils equal, extraocular movements intact  Respiratory: Patient's speak in full sentences and does not appear short of breath  Cardiovascular: No lower extremity edema, non tender, no erythema  Neuro: Cranial nerves II through XII are intact, neurovascularly intact in all extremities with 2+ DTRs and 2+ pulses.  Gait normal with good balance and coordination.  MSK:  Non tender with full range of motion and good stability and symmetric strength and tone of shoulders, elbows, wrist, hip, knee and ankles bilaterally.     Impression and Recommendations:      The above documentation has been reviewed and is accurate and complete Tony Robertson

## 2020-11-07 ENCOUNTER — Ambulatory Visit: Payer: 59 | Admitting: Family Medicine

## 2020-11-15 ENCOUNTER — Ambulatory Visit: Payer: 59 | Admitting: Family Medicine

## 2020-12-01 ENCOUNTER — Other Ambulatory Visit: Payer: Self-pay | Admitting: Family

## 2021-02-22 ENCOUNTER — Other Ambulatory Visit: Payer: Self-pay | Admitting: Family

## 2021-03-04 ENCOUNTER — Other Ambulatory Visit: Payer: Self-pay | Admitting: Family

## 2021-06-23 ENCOUNTER — Other Ambulatory Visit: Payer: Self-pay | Admitting: Family

## 2021-06-24 ENCOUNTER — Telehealth: Payer: Self-pay | Admitting: Family

## 2021-06-24 NOTE — Telephone Encounter (Signed)
Can you call to find out what he plans to do for continued care? Due for OV in early September; I did refill for #90 days so he needs to go ahead and schedule appointment here or at [facility].

## 2021-06-24 NOTE — Telephone Encounter (Signed)
Pt stated that he will continue his care with Mickel Baas. He is now scheduled for 07/04/21 @ 10:40.

## 2021-07-04 ENCOUNTER — Ambulatory Visit: Payer: 59 | Admitting: Family

## 2021-07-25 IMAGING — DX DG LUMBAR SPINE 2-3V
3 series · 3 of 3 positions shown · non-contrast
Comparison: None.

CLINICAL DATA: Lumbar pain.

EXAM:
LUMBAR SPINE - 2-3 VIEW

[l-spine ap]
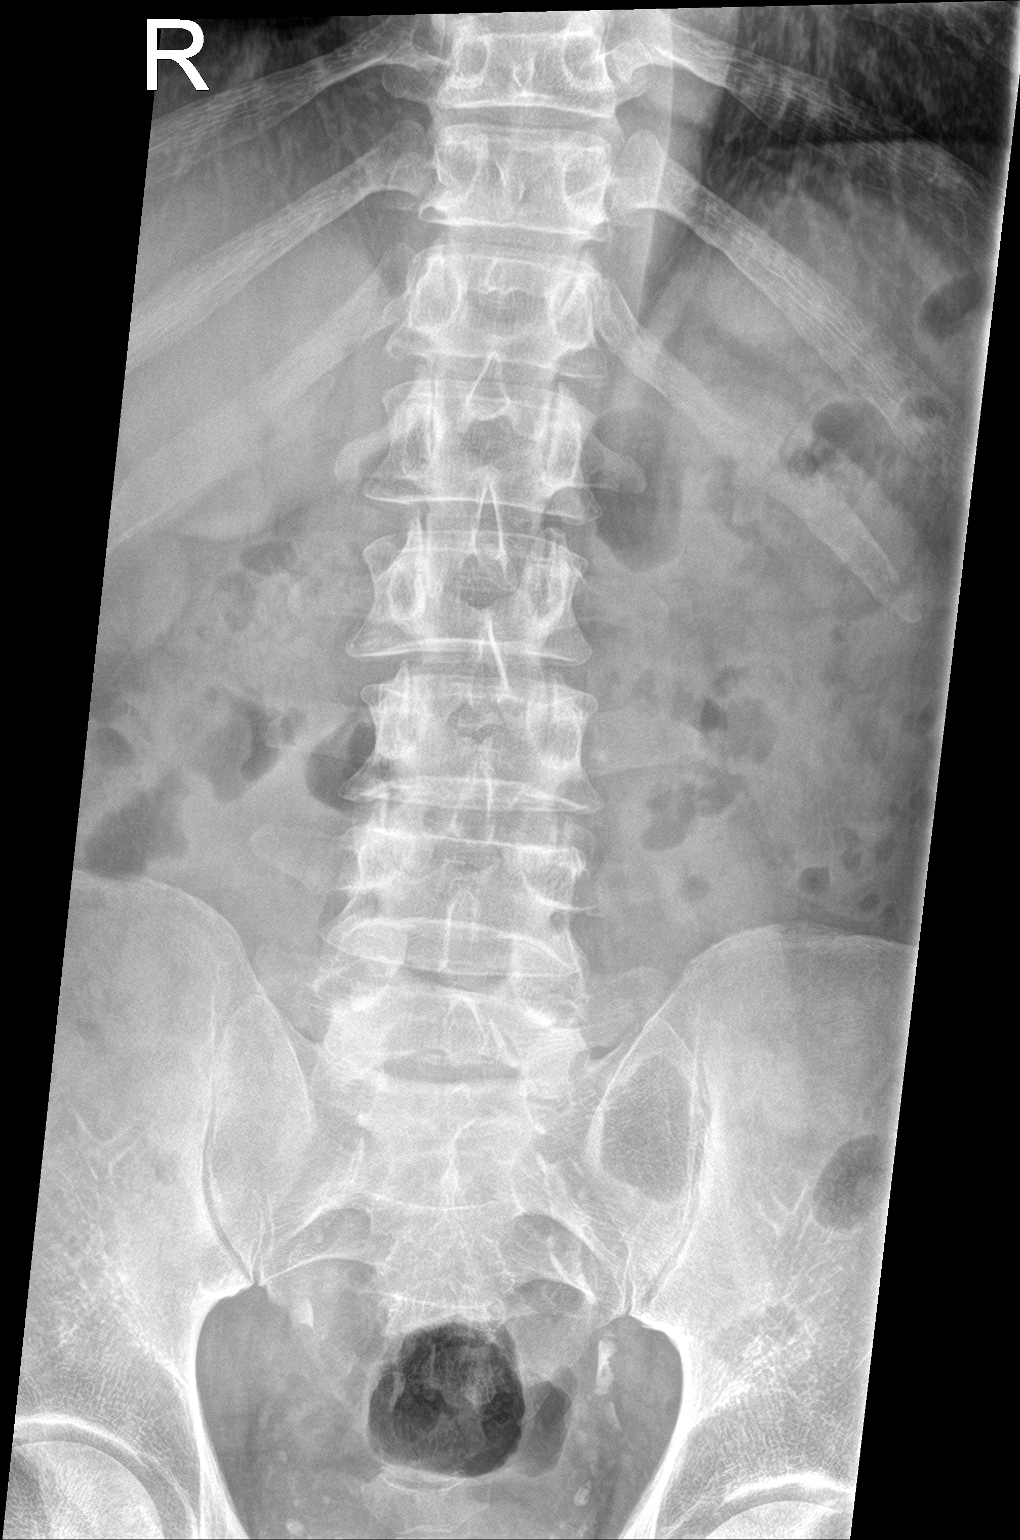

[l-spine lat]
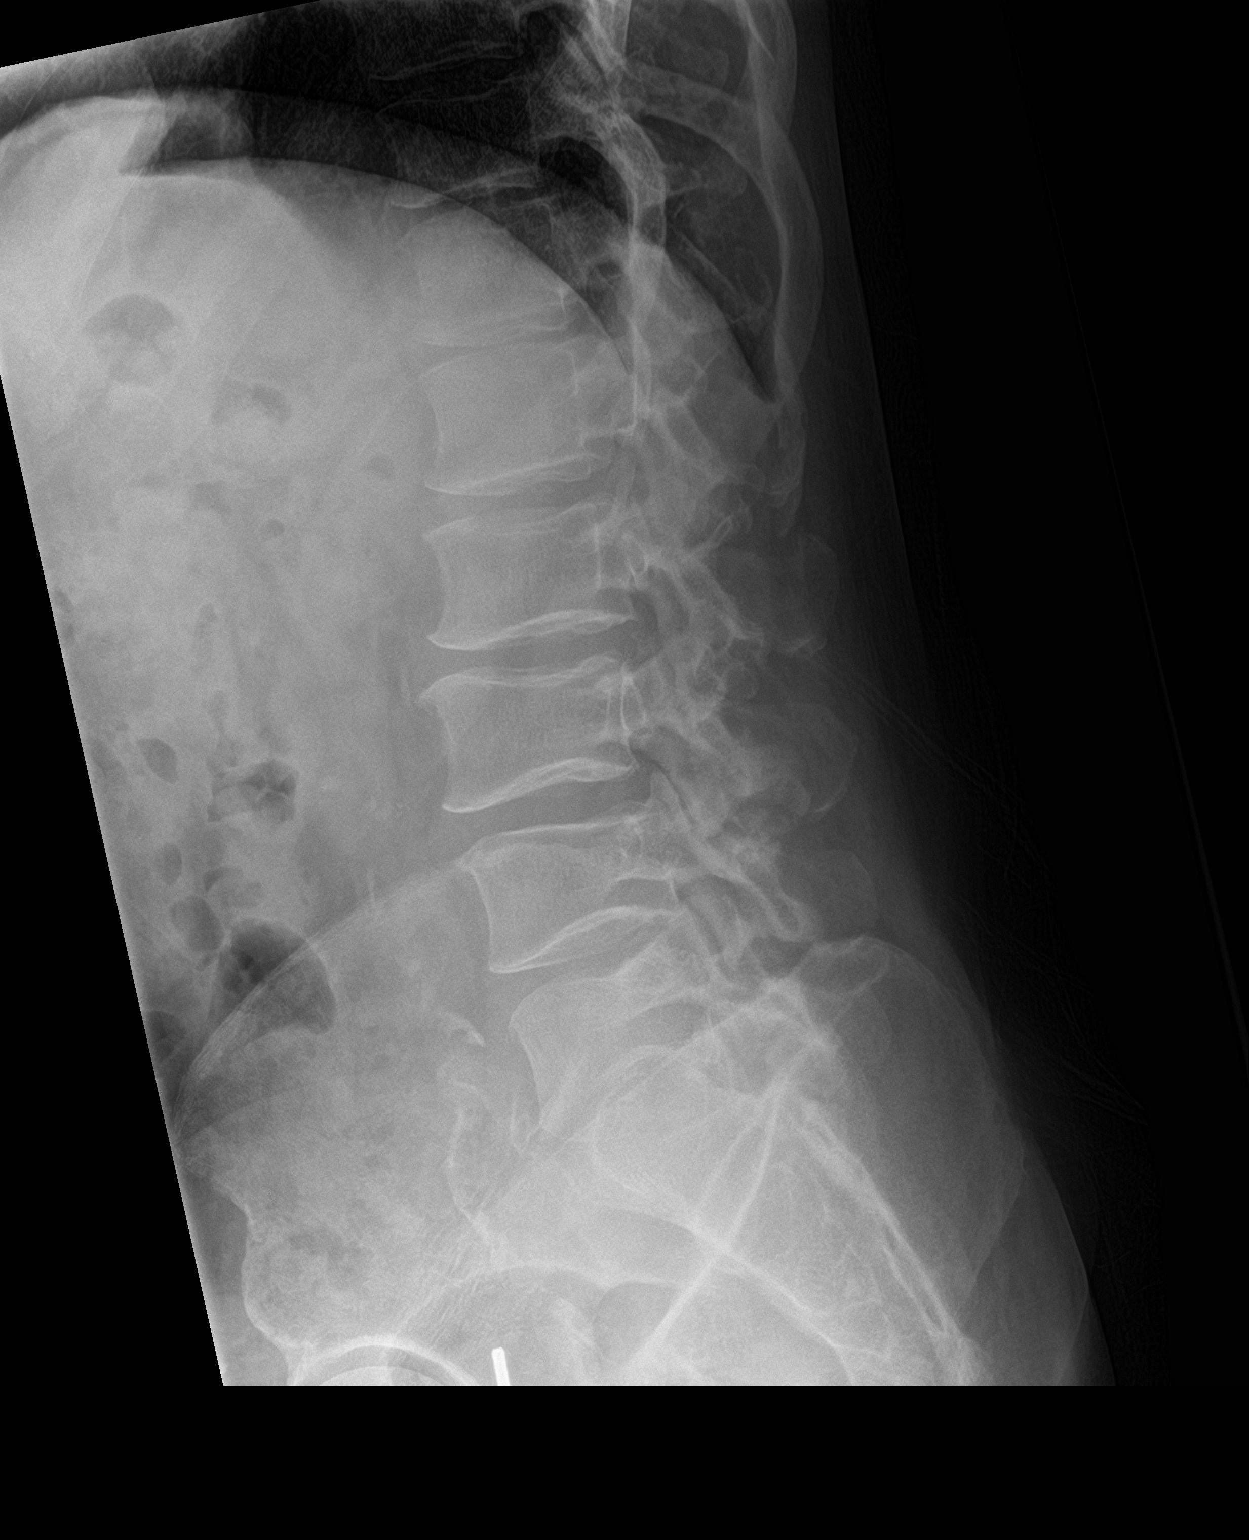

[l-spine spot]
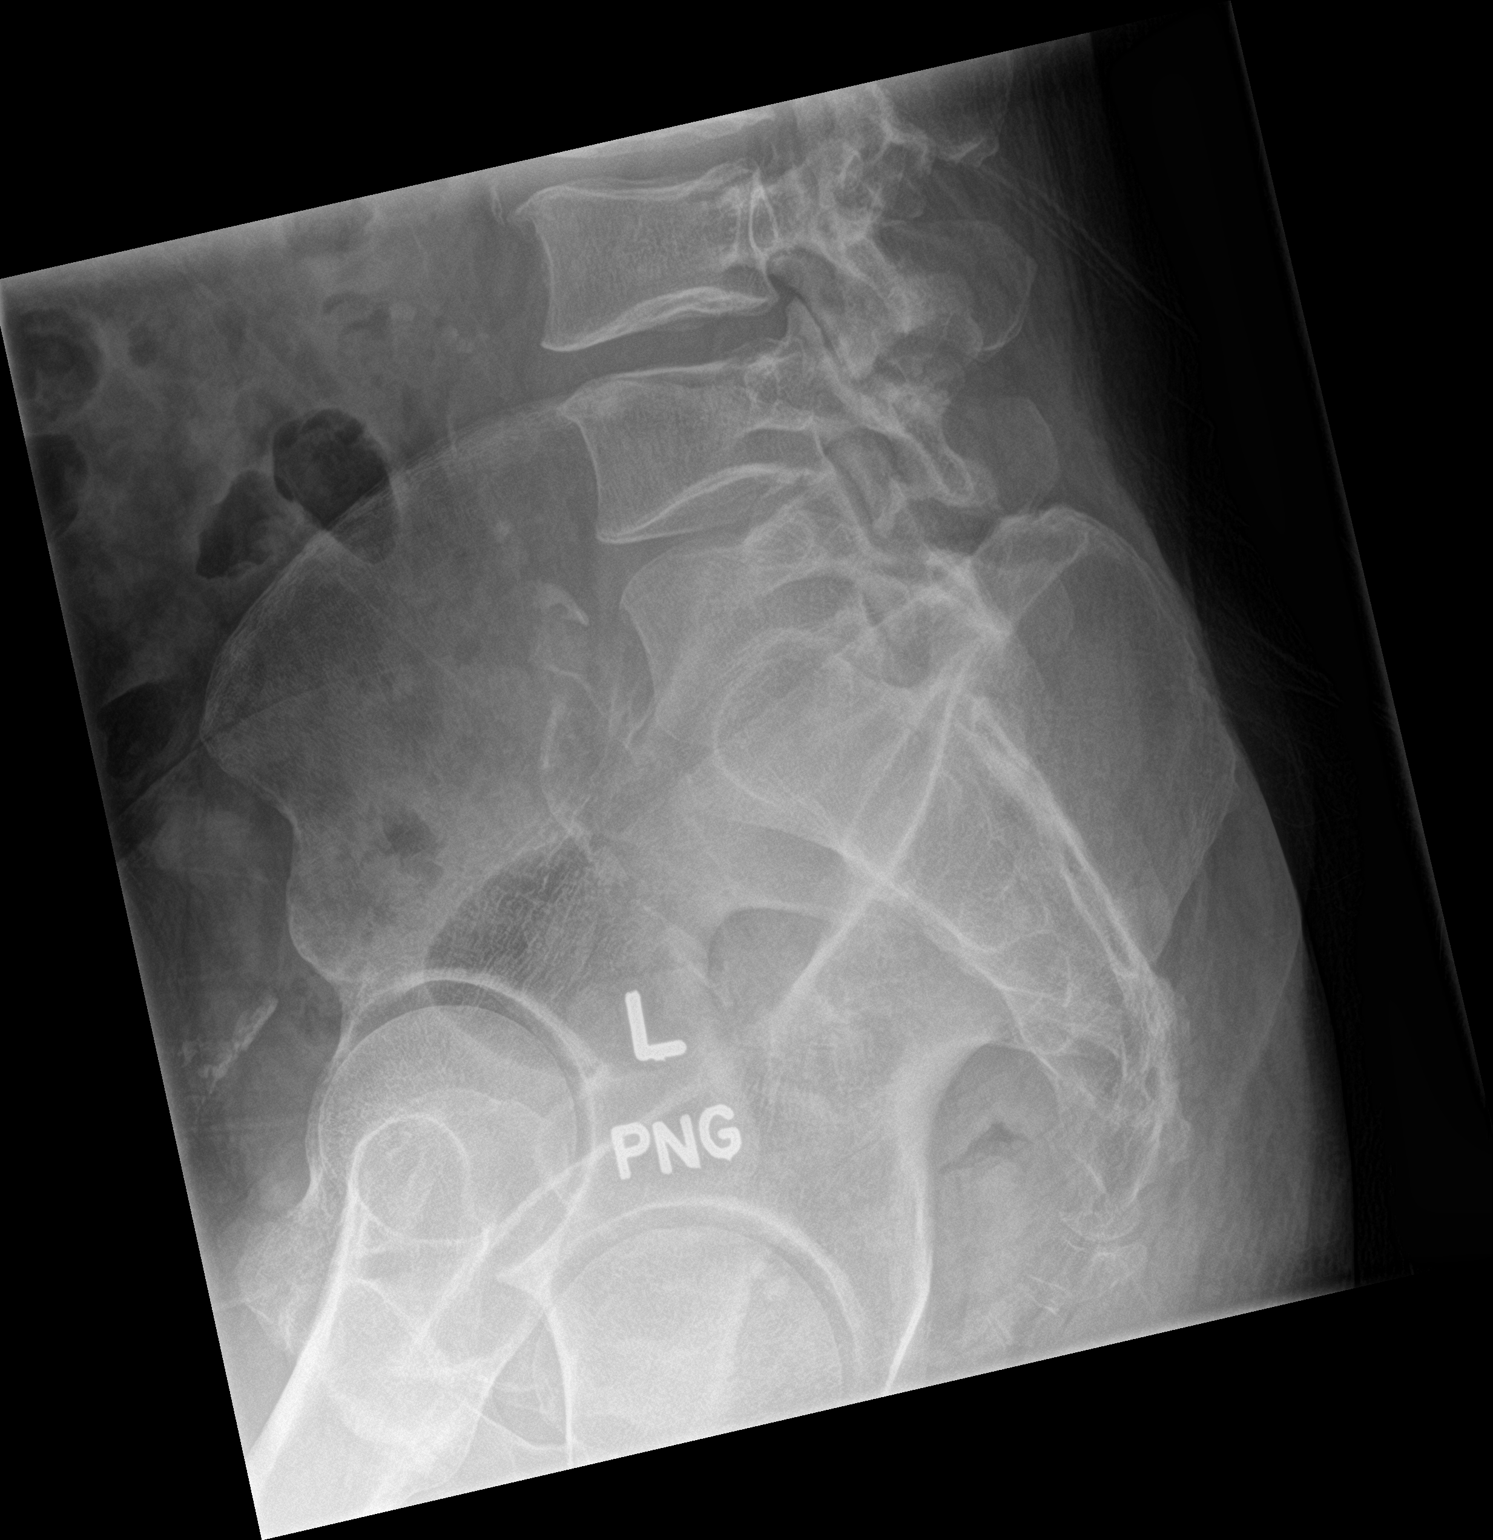

[3 of 3 positions shown; findings below may reference images not displayed]

FINDINGS: Multilevel degenerative disc disease with small anterior
osteophytes. No fractures are seen. No malalignment. Atherosclerotic
change seen in the distal aorta and iliac vessels.
IMPRESSION: 1. No fracture or malalignment.
2. Degenerative disc disease.
3. Atherosclerotic changes in the distal aorta and iliac vessels.

## 2021-10-22 ENCOUNTER — Other Ambulatory Visit: Payer: Self-pay | Admitting: Family

## 2021-10-22 DIAGNOSIS — Z1211 Encounter for screening for malignant neoplasm of colon: Secondary | ICD-10-CM

## 2021-12-11 ENCOUNTER — Other Ambulatory Visit: Payer: Self-pay | Admitting: Family

## 2022-01-14 ENCOUNTER — Ambulatory Visit (INDEPENDENT_AMBULATORY_CARE_PROVIDER_SITE_OTHER): Payer: 59 | Admitting: Family

## 2022-01-14 ENCOUNTER — Encounter: Payer: Self-pay | Admitting: Family

## 2022-01-14 VITALS — BP 122/70 | HR 101 | Temp 98.3°F | Resp 18 | Ht 70.0 in | Wt 208.4 lb

## 2022-01-14 DIAGNOSIS — M545 Low back pain, unspecified: Secondary | ICD-10-CM

## 2022-01-14 DIAGNOSIS — I10 Essential (primary) hypertension: Secondary | ICD-10-CM | POA: Diagnosis not present

## 2022-01-14 DIAGNOSIS — Z125 Encounter for screening for malignant neoplasm of prostate: Secondary | ICD-10-CM

## 2022-01-14 DIAGNOSIS — E785 Hyperlipidemia, unspecified: Secondary | ICD-10-CM | POA: Diagnosis not present

## 2022-01-14 DIAGNOSIS — G8929 Other chronic pain: Secondary | ICD-10-CM

## 2022-01-14 MED ORDER — FENOFIBRATE 145 MG PO TABS: 145.0000 mg | ORAL_TABLET | Freq: Every day | ORAL | 3 refills | Status: DC

## 2022-01-14 MED ORDER — MELOXICAM 15 MG PO TABS: 15.0000 mg | ORAL_TABLET | Freq: Every day | ORAL | 0 refills | Status: DC

## 2022-01-14 MED ORDER — AMLODIPINE BESYLATE 10 MG PO TABS: 10.0000 mg | ORAL_TABLET | Freq: Every day | ORAL | 3 refills | Status: DC

## 2022-01-14 NOTE — Progress Notes (Signed)
Tony Robertson. is a 51 y.o. male with the following history as recorded in EpicCare:  Patient Active Problem List   Diagnosis Date Noted   Bilateral low back pain without sciatica 10/20/2019   Routine general medical examination at a health care facility 11/02/2012   Other abnormal glucose 11/02/2012   Essential hypertension, benign 11/02/2012   Shifting sleep-work schedule, affecting sleep 04/16/2011   Hypertriglyceridemia 03/26/2011   Tobacco abuse 03/26/2011   SLEEP APNEA 03/12/2011    Current Outpatient Medications  Medication Sig Dispense Refill   amLODipine (NORVASC) 10 MG tablet Take 1 tablet (10 mg total) by mouth daily. 90 tablet 3   fenofibrate (TRICOR) 145 MG tablet Take 1 tablet (145 mg total) by mouth daily. 90 tablet 3   gabapentin (NEURONTIN) 100 MG capsule Take 2 capsules (200 mg total) by mouth at bedtime. (Patient not taking: Reported on 01/14/2022) 180 capsule 3   meloxicam (MOBIC) 15 MG tablet Take 1 tablet (15 mg total) by mouth daily. 30 tablet 0   No current facility-administered medications for this visit.    Allergies: Penicillins  Past Medical History:  Diagnosis Date   Anemia    High cholesterol    Hypertension    Tobacco abuse     Past Surgical History:  Procedure Laterality Date   NO PAST SURGERIES      Family History  Problem Relation Age of Onset   Hyperlipidemia Father    Heart disease Father    Hypertension Father    Rheum arthritis Mother    Hypertension Mother    Hypertension Sister    Hypertension Other    Cancer Maternal Aunt    Cancer Maternal Grandmother    Asthma Cousin     Social History   Tobacco Use   Smoking status: Every Day    Packs/day: 0.50    Years: 20.00    Pack years: 10.00    Types: Cigarettes   Smokeless tobacco: Never  Substance Use Topics   Alcohol use: Yes    Alcohol/week: 10.0 standard drinks    Types: 5 Cans of beer, 5 Shots of liquor per week    Comment: 2-3 beers a day    Subjective:   Accompanied by wife; Has not been seen since August 2021;  Needs refill on blood pressure and cholesterol medication; overdue for labs as well; Chronic low back pain- notes symptoms have been worsening recently; had to miss work for a week almost 2 weeks ago;    Objective:  Vitals:   01/14/22 1037  BP: 122/70  Pulse: (!) 101  Resp: 18  Temp: 98.3 F (36.8 C)  TempSrc: Oral  SpO2: 98%  Weight: 208 lb 6.4 oz (94.5 kg)  Height: _0  (1.778 m)    General: Well developed, well nourished, in no acute distress  Skin : Warm and dry.  Head: Normocephalic and atraumatic  Eyes: Sclera and conjunctiva clear; pupils round and reactive to light; extraocular movements intact  Ears: External normal; canals clear; tympanic membranes normal  Oropharynx: Pink, supple. No suspicious lesions  Neck: Supple without thyromegaly, adenopathy  Lungs: Respirations unlabored; clear to auscultation bilaterally without wheeze, rales, rhonchi  CVS exam: normal rate and regular rhythm.  Musculoskeletal: No deformities; no active joint inflammation  Extremities: No edema, cyanosis, clubbing  Vessels: Symmetric bilaterally  Neurologic: Alert and oriented; speech intact; face symmetrical; moves all extremities well; CNII-XII intact without focal deficit   Assessment:  1. Chronic low back pain, unspecified back  pain laterality, unspecified whether sciatica present   2. Essential hypertension   3. Hyperlipidemia, unspecified hyperlipidemia type   4. Prostate cancer screening     Plan:  Update x-ray; refill on Mobic; refer back to sports medicine; Stable; refill updated; check CBC, CMP today;  Stable; refill updated; check lipid panel; Check PSA;   This visit occurred during the SARS-CoV-2 public health emergency.  Safety protocols were in place, including screening questions prior to the visit, additional usage of staff PPE, and extensive cleaning of exam room while observing appropriate contact time as  indicated for disinfecting solutions.    No follow-ups on file.  Orders Placed This Encounter  Procedures   DG Lumbar Spine Complete    Standing Status:   Future    Standing Expiration Date:   01/14/2023    Order Specific Question:   Reason for Exam (SYMPTOM  OR DIAGNOSIS REQUIRED)    Answer:   low back pain    Order Specific Question:   Preferred imaging location?    Answer:   [facility]   CBC with Differential/Platelet    Standing Status:   Future    Standing Expiration Date:   01/14/2023   Comp Met (CMET)    Standing Status:   Future    Standing Expiration Date:   01/14/2023   Lipid panel    Standing Status:   Future    Standing Expiration Date:   01/14/2023   PSA    Standing Status:   Future    Standing Expiration Date:   01/14/2023   Ambulatory referral to Sports Medicine    Referral Priority:   Routine    Referral Type:   Consultation    Number of Visits Requested:   1    Requested Prescriptions   Signed Prescriptions Disp Refills   amLODipine (NORVASC) 10 MG tablet 90 tablet 3    Sig: Take 1 tablet (10 mg total) by mouth daily.   fenofibrate (TRICOR) 145 MG tablet 90 tablet 3    Sig: Take 1 tablet (145 mg total) by mouth daily.   meloxicam (MOBIC) 15 MG tablet 30 tablet 0    Sig: Take 1 tablet (15 mg total) by mouth daily.

## 2022-01-17 ENCOUNTER — Other Ambulatory Visit (INDEPENDENT_AMBULATORY_CARE_PROVIDER_SITE_OTHER): Payer: 59

## 2022-01-17 ENCOUNTER — Other Ambulatory Visit: Payer: Self-pay | Admitting: Family

## 2022-01-17 DIAGNOSIS — Z125 Encounter for screening for malignant neoplasm of prostate: Secondary | ICD-10-CM

## 2022-01-17 DIAGNOSIS — D649 Anemia, unspecified: Secondary | ICD-10-CM

## 2022-01-17 DIAGNOSIS — E785 Hyperlipidemia, unspecified: Secondary | ICD-10-CM

## 2022-01-17 DIAGNOSIS — R195 Other fecal abnormalities: Secondary | ICD-10-CM

## 2022-01-17 DIAGNOSIS — I10 Essential (primary) hypertension: Secondary | ICD-10-CM

## 2022-01-17 LAB — COMPREHENSIVE METABOLIC PANEL
ALT: 16 U/L (ref 0–53)
AST: 33 U/L (ref 0–37)
Albumin: 4.4 g/dL (ref 3.5–5.2)
Alkaline Phosphatase: 48 U/L (ref 39–117)
BUN: 12 mg/dL (ref 6–23)
CO2: 26 mEq/L (ref 19–32)
Calcium: 9.7 mg/dL (ref 8.4–10.5)
Chloride: 103 mEq/L (ref 96–112)
Creatinine, Ser: 0.95 mg/dL (ref 0.40–1.50)
GFR: 92.6 mL/min (ref >60.00)
Glucose, Bld: 87 mg/dL (ref 70–99)
Potassium: 3.7 mEq/L (ref 3.5–5.1)
Sodium: 139 mEq/L (ref 135–145)
Total Bilirubin: 0.5 mg/dL (ref 0.2–1.2)
Total Protein: 7.6 g/dL (ref 6.0–8.3)

## 2022-01-17 LAB — PSA: PSA: 1.19 ng/mL (ref 0.10–4.00)

## 2022-01-17 LAB — LIPID PANEL
Cholesterol: 158 mg/dL (ref 0–200)
HDL: 71.8 mg/dL (ref >39.00)
NonHDL: 86.63
Total CHOL/HDL Ratio: 2
Triglycerides: 202 mg/dL — ABNORMAL HIGH (ref 0.0–149.0)
VLDL: 40.4 mg/dL — ABNORMAL HIGH (ref 0.0–40.0)

## 2022-01-17 LAB — CBC WITH DIFFERENTIAL/PLATELET
Basophils Absolute: 0.1 10*3/uL (ref 0.0–0.1)
Basophils Relative: 1.1 % (ref 0.0–3.0)
Eosinophils Absolute: 0.1 10*3/uL (ref 0.0–0.7)
Eosinophils Relative: 2.1 % (ref 0.0–5.0)
HCT: 35.9 % — ABNORMAL LOW (ref 39.0–52.0)
Hemoglobin: 11.7 g/dL — ABNORMAL LOW (ref 13.0–17.0)
Lymphocytes Relative: 39.1 % (ref 12.0–46.0)
Lymphs Abs: 2.7 10*3/uL (ref 0.7–4.0)
MCHC: 32.6 g/dL (ref 30.0–36.0)
MCV: 89.2 fl (ref 78.0–100.0)
Monocytes Absolute: 0.6 10*3/uL (ref 0.1–1.0)
Monocytes Relative: 8.6 % (ref 3.0–12.0)
Neutro Abs: 3.4 10*3/uL (ref 1.4–7.7)
Neutrophils Relative %: 49.1 % (ref 43.0–77.0)
Platelets: 394 10*3/uL (ref 150.0–400.0)
RBC: 4.02 Mil/uL — ABNORMAL LOW (ref 4.22–5.81)
RDW: 15 % (ref 11.5–15.5)
WBC: 7 10*3/uL (ref 4.0–10.5)

## 2022-01-17 LAB — LDL CHOLESTEROL, DIRECT: Direct LDL: 43 mg/dL

## 2022-02-06 ENCOUNTER — Other Ambulatory Visit (INDEPENDENT_AMBULATORY_CARE_PROVIDER_SITE_OTHER): Payer: 59

## 2022-02-06 DIAGNOSIS — D649 Anemia, unspecified: Secondary | ICD-10-CM | POA: Diagnosis not present

## 2022-02-06 LAB — CBC WITH DIFFERENTIAL/PLATELET
Basophils Absolute: 0.1 10*3/uL (ref 0.0–0.1)
Basophils Relative: 0.9 % (ref 0.0–3.0)
Eosinophils Absolute: 0.3 10*3/uL (ref 0.0–0.7)
Eosinophils Relative: 4.1 % (ref 0.0–5.0)
HCT: 34.3 % — ABNORMAL LOW (ref 39.0–52.0)
Hemoglobin: 11.2 g/dL — ABNORMAL LOW (ref 13.0–17.0)
Lymphocytes Relative: 42.5 % (ref 12.0–46.0)
Lymphs Abs: 2.7 10*3/uL (ref 0.7–4.0)
MCHC: 32.8 g/dL (ref 30.0–36.0)
MCV: 88.5 fl (ref 78.0–100.0)
Monocytes Absolute: 0.8 10*3/uL (ref 0.1–1.0)
Monocytes Relative: 12.8 % — ABNORMAL HIGH (ref 3.0–12.0)
Neutro Abs: 2.5 10*3/uL (ref 1.4–7.7)
Neutrophils Relative %: 39.7 % — ABNORMAL LOW (ref 43.0–77.0)
Platelets: 264 10*3/uL (ref 150.0–400.0)
RBC: 3.87 Mil/uL — ABNORMAL LOW (ref 4.22–5.81)
RDW: 16.5 % — ABNORMAL HIGH (ref 11.5–15.5)
WBC: 6.3 10*3/uL (ref 4.0–10.5)

## 2022-02-06 LAB — IRON,TIBC AND FERRITIN PANEL
%SAT: 11 % (calc) — ABNORMAL LOW (ref 20–48)
Ferritin: 502 ng/mL — ABNORMAL HIGH (ref 38–380)
Iron: 52 ug/dL (ref 50–180)
TIBC: 483 mcg/dL (calc) — ABNORMAL HIGH (ref 250–425)

## 2022-02-07 ENCOUNTER — Other Ambulatory Visit: Payer: Self-pay | Admitting: Family

## 2022-02-07 DIAGNOSIS — D649 Anemia, unspecified: Secondary | ICD-10-CM

## 2022-02-07 DIAGNOSIS — R7989 Other specified abnormal findings of blood chemistry: Secondary | ICD-10-CM

## 2022-02-21 ENCOUNTER — Inpatient Hospital Stay: Payer: 59

## 2022-02-21 ENCOUNTER — Inpatient Hospital Stay: Payer: 59 | Admitting: Family

## 2022-02-21 ENCOUNTER — Other Ambulatory Visit: Payer: Self-pay | Admitting: Family

## 2022-02-21 DIAGNOSIS — D649 Anemia, unspecified: Secondary | ICD-10-CM

## 2022-02-26 ENCOUNTER — Other Ambulatory Visit: Payer: Self-pay | Admitting: Family

## 2022-02-27 ENCOUNTER — Inpatient Hospital Stay: Payer: 59

## 2022-02-27 ENCOUNTER — Inpatient Hospital Stay: Payer: 59 | Admitting: Hematology & Oncology

## 2022-02-27 ENCOUNTER — Telehealth: Payer: Self-pay | Admitting: *Deleted

## 2022-02-27 NOTE — Telephone Encounter (Signed)
Patient called at the last minute and said that he could not get off from work and needed to reschedule the new heme appointment. Appointment has been rescheduled. ?

## 2022-03-04 ENCOUNTER — Telehealth: Payer: Self-pay | Admitting: Family

## 2022-03-04 ENCOUNTER — Inpatient Hospital Stay: Payer: 59 | Attending: Hematology & Oncology

## 2022-03-04 ENCOUNTER — Inpatient Hospital Stay: Payer: 59

## 2022-03-04 ENCOUNTER — Other Ambulatory Visit: Payer: Self-pay

## 2022-03-04 ENCOUNTER — Encounter: Payer: Self-pay | Admitting: Family

## 2022-03-04 ENCOUNTER — Inpatient Hospital Stay (HOSPITAL_BASED_OUTPATIENT_CLINIC_OR_DEPARTMENT_OTHER): Payer: 59 | Admitting: Family

## 2022-03-04 ENCOUNTER — Encounter: Payer: 59 | Admitting: Family

## 2022-03-04 VITALS — BP 131/84 | HR 78 | Temp 98.5°F | Resp 17 | Wt 209.4 lb

## 2022-03-04 DIAGNOSIS — Z836 Family history of other diseases of the respiratory system: Secondary | ICD-10-CM | POA: Diagnosis not present

## 2022-03-04 DIAGNOSIS — Z880 Allergy status to penicillin: Secondary | ICD-10-CM | POA: Insufficient documentation

## 2022-03-04 DIAGNOSIS — D649 Anemia, unspecified: Secondary | ICD-10-CM

## 2022-03-04 DIAGNOSIS — Z8261 Family history of arthritis: Secondary | ICD-10-CM

## 2022-03-04 DIAGNOSIS — Z8349 Family history of other endocrine, nutritional and metabolic diseases: Secondary | ICD-10-CM | POA: Diagnosis not present

## 2022-03-04 DIAGNOSIS — M545 Low back pain, unspecified: Secondary | ICD-10-CM | POA: Diagnosis not present

## 2022-03-04 DIAGNOSIS — R5383 Other fatigue: Secondary | ICD-10-CM | POA: Diagnosis not present

## 2022-03-04 DIAGNOSIS — Z8249 Family history of ischemic heart disease and other diseases of the circulatory system: Secondary | ICD-10-CM | POA: Diagnosis not present

## 2022-03-04 DIAGNOSIS — R0602 Shortness of breath: Secondary | ICD-10-CM | POA: Insufficient documentation

## 2022-03-04 DIAGNOSIS — Z79899 Other long term (current) drug therapy: Secondary | ICD-10-CM | POA: Insufficient documentation

## 2022-03-04 DIAGNOSIS — Z803 Family history of malignant neoplasm of breast: Secondary | ICD-10-CM | POA: Diagnosis not present

## 2022-03-04 DIAGNOSIS — D509 Iron deficiency anemia, unspecified: Secondary | ICD-10-CM

## 2022-03-04 DIAGNOSIS — F17210 Nicotine dependence, cigarettes, uncomplicated: Secondary | ICD-10-CM

## 2022-03-04 DIAGNOSIS — Z809 Family history of malignant neoplasm, unspecified: Secondary | ICD-10-CM | POA: Diagnosis not present

## 2022-03-04 DIAGNOSIS — G8929 Other chronic pain: Secondary | ICD-10-CM

## 2022-03-04 LAB — SAVE SMEAR(SSMR), FOR PROVIDER SLIDE REVIEW

## 2022-03-04 LAB — RETICULOCYTES
Immature Retic Fract: 17.1 % — ABNORMAL HIGH (ref 2.3–15.9)
RBC.: 4.09 MIL/uL — ABNORMAL LOW (ref 4.22–5.81)
Retic Count, Absolute: 78.9 10*3/uL (ref 19.0–186.0)
Retic Ct Pct: 1.9 % (ref 0.4–3.1)

## 2022-03-04 LAB — CMP (CANCER CENTER ONLY)
ALT: 35 U/L (ref 0–44)
AST: 80 U/L — ABNORMAL HIGH (ref 15–41)
Albumin: 4 g/dL (ref 3.5–5.0)
Alkaline Phosphatase: 56 U/L (ref 38–126)
Anion gap: 11 (ref 5–15)
BUN: 18 mg/dL (ref 6–20)
CO2: 26 mmol/L (ref 22–32)
Calcium: 9.1 mg/dL (ref 8.9–10.3)
Chloride: 103 mmol/L (ref 98–111)
Creatinine: 1.06 mg/dL (ref 0.61–1.24)
GFR, Estimated: >60 mL/min (ref >60)
Glucose, Bld: 86 mg/dL (ref 70–99)
Potassium: 3.7 mmol/L (ref 3.5–5.1)
Sodium: 140 mmol/L (ref 135–145)
Total Bilirubin: 0.5 mg/dL (ref 0.3–1.2)
Total Protein: 7.2 g/dL (ref 6.5–8.1)

## 2022-03-04 LAB — CBC WITH DIFFERENTIAL (CANCER CENTER ONLY)
Abs Immature Granulocytes: 0.02 10*3/uL (ref 0.00–0.07)
Basophils Absolute: 0.1 10*3/uL (ref 0.0–0.1)
Basophils Relative: 1 %
Eosinophils Absolute: 0.1 10*3/uL (ref 0.0–0.5)
Eosinophils Relative: 1 %
HCT: 35 % — ABNORMAL LOW (ref 39.0–52.0)
Hemoglobin: 12.1 g/dL — ABNORMAL LOW (ref 13.0–17.0)
Immature Granulocytes: 0 %
Lymphocytes Relative: 35 %
Lymphs Abs: 2.4 10*3/uL (ref 0.7–4.0)
MCH: 29.4 pg (ref 26.0–34.0)
MCHC: 34.6 g/dL (ref 30.0–36.0)
MCV: 85.2 fL (ref 80.0–100.0)
Monocytes Absolute: 0.7 10*3/uL (ref 0.1–1.0)
Monocytes Relative: 10 %
Neutro Abs: 3.7 10*3/uL (ref 1.7–7.7)
Neutrophils Relative %: 53 %
Platelet Count: 271 10*3/uL (ref 150–400)
RBC: 4.11 MIL/uL — ABNORMAL LOW (ref 4.22–5.81)
RDW: 15.9 % — ABNORMAL HIGH (ref 11.5–15.5)
WBC Count: 7 10*3/uL (ref 4.0–10.5)
nRBC: 0 % (ref 0.0–0.2)

## 2022-03-04 LAB — IRON AND IRON BINDING CAPACITY (CC-WL,HP ONLY)
Iron: 299 ug/dL — ABNORMAL HIGH (ref 45–182)
Saturation Ratios: 63 % — ABNORMAL HIGH (ref 17.9–39.5)
TIBC: 477 ug/dL — ABNORMAL HIGH (ref 250–450)
UIBC: 178 ug/dL (ref 117–376)

## 2022-03-04 LAB — FERRITIN: Ferritin: 1404 ng/mL — ABNORMAL HIGH (ref 24–336)

## 2022-03-04 LAB — LACTATE DEHYDROGENASE: LDH: 193 U/L — ABNORMAL HIGH (ref 98–192)

## 2022-03-04 NOTE — Progress Notes (Signed)
Hematology/Oncology Consultation   Name: Cainon Shuba.      MRN: [id number]    Location: Room/bed info not found  Date: 03/04/2022 Time:8:43 AM   REFERRING PHYSICIAN: Eustace Moore, FNP  REASON FOR CONSULT:  Anemia unspecified, high serum ferritin   DIAGNOSIS: Iron deficiency anemia   HISTORY OF PRESENT ILLNESS: Ms. Warwick is a pleasant 51 yo gentleman with intermittent mild anemia over the the last 9 years. He most recently had iron studies checked and his iron saturation was low at 11% and ferritin 502.  He denies taking an oral iron supplement or multivitamin.  He notes fatigue and SOB with exertion.  He states that he is waiting to schedule his colonoscopy with [facility] to further assess for cause of anemia.  No known family history of cancer.  No personal history of cancer. His mother recently had surgery for new diagnosis of breast cancer.  No history of diabetes or thyroid disease.  No surgical history per patient.  No fever, chills, n/v, cough, rash, dizziness, SOB, chest pain, palpitations, abdominal pain or changes in bowel or bladder habits.  No swelling, tenderness, numbness or tingling in his extremities at this time.  He has chronic lower back pain from years of heavy lifting with his job at [employer].  No falls or syncope to report.  He smokes 5-6 cigarettes a day. No recreational drug use. He has an occasional beer or mixed drink socially.  He has maintained a good appetite and does eat quite a bit of red meat. He is doing his best to stay well hydrated. His weight is stable at 209 lbs.   ROS: All other 10 point review of systems is negative.   PAST MEDICAL HISTORY:   Past Medical History:  Diagnosis Date   Anemia    High cholesterol    Hypertension    Tobacco abuse     ALLERGIES: Allergies  Allergen Reactions   Penicillins Other (See Comments)    Unknown_childhood reaction Has patient had a PCN reaction causing immediate rash,  facial/tongue/throat swelling, SOB or lightheadedness with hypotension: unknown Has patient had a PCN reaction causing severe rash involving mucus membranes or skin necrosis: unknown Has patient had a PCN reaction that required hospitalization No Has patient had a PCN reaction occurring within the last 10 years: No If all of the above answers are "NO", then may proceed with Cephalosporin use.       MEDICATIONS:  Current Outpatient Medications on File Prior to Visit  Medication Sig Dispense Refill   amLODipine (NORVASC) 10 MG tablet Take 1 tablet (10 mg total) by mouth daily. 90 tablet 3   fenofibrate (TRICOR) 145 MG tablet Take 1 tablet (145 mg total) by mouth daily. 90 tablet 3   gabapentin (NEURONTIN) 100 MG capsule Take 2 capsules (200 mg total) by mouth at bedtime. (Patient not taking: Reported on 01/14/2022) 180 capsule 3   meloxicam (MOBIC) 15 MG tablet TAKE 1 TABLET (15 MG TOTAL) BY MOUTH DAILY. 30 tablet 0   No current facility-administered medications on file prior to visit.     PAST SURGICAL HISTORY Past Surgical History:  Procedure Laterality Date   NO PAST SURGERIES      FAMILY HISTORY: Family History  Problem Relation Age of Onset   Hyperlipidemia Father    Heart disease Father    Hypertension Father    Rheum arthritis Mother    Hypertension Mother    Hypertension Sister    Hypertension  Other    Cancer Maternal Aunt    Cancer Maternal Grandmother    Asthma Cousin     SOCIAL HISTORY:  reports that he has been smoking cigarettes. He has a 10.00 pack-year smoking history. He has never used smokeless tobacco. He reports current alcohol use of about 10.0 standard drinks per week. He reports that he does not use drugs.  PERFORMANCE STATUS: The patient's performance status is 1 - Symptomatic but completely ambulatory  PHYSICAL EXAM: Most Recent Vital Signs: There were no vitals taken for this visit. BP 131/84 (BP Location: Left Arm, Patient Position: Sitting)    Pulse 78   Temp 98.5 F (36.9 C) (Oral)   Resp 17   Wt 209 lb 6.4 oz (95 kg)   SpO2 100%   BMI 30.05 kg/m   General Appearance:    Alert, cooperative, no distress, appears stated age  Head:    Normocephalic, without obvious abnormality, atraumatic  Eyes:    PERRL, conjunctiva/corneas clear, EOM's intact, fundi    benign, both eyes             Throat:   Lips, mucosa, and tongue normal; teeth and gums normal  Neck:   Supple, symmetrical, trachea midline, no adenopathy;       thyroid:  No enlargement/tenderness/nodules; no carotid   bruit or JVD  Back:     Symmetric, no curvature, ROM normal, no CVA tenderness  Lungs:     Clear to auscultation bilaterally, respirations unlabored  Chest wall:    No tenderness or deformity  Heart:    Regular rate and rhythm, S1 and S2 normal, no murmur, rub   or gallop  Abdomen:     Soft, non-tender, bowel sounds active all four quadrants,    no masses, no organomegaly        Extremities:   Extremities normal, atraumatic, no cyanosis or edema  Pulses:   2+ and symmetric all extremities  Skin:   Skin color, texture, turgor normal, no rashes or lesions  Lymph nodes:   Cervical, supraclavicular, and axillary nodes normal  Neurologic:   CNII-XII intact. Normal strength, sensation and reflexes      throughout    LABORATORY DATA:  Results for orders placed or performed in visit on 03/04/22 (from the past 48 hour(s))  CBC with Differential (Cancer Center Only)     Status: Abnormal   Collection Time: 03/04/22  8:03 AM  Result Value Ref Range   WBC Count 7.0 4.0 - 10.5 K/uL   RBC 4.11 (L) 4.22 - 5.81 MIL/uL   Hemoglobin 12.1 (L) 13.0 - 17.0 g/dL   HCT 16.1 (L) 09.6 - 04.5 %   MCV 85.2 80.0 - 100.0 fL   MCH 29.4 26.0 - 34.0 pg   MCHC 34.6 30.0 - 36.0 g/dL   RDW 40.9 (H) 81.1 - 91.4 %   Platelet Count 271 150 - 400 K/uL   nRBC 0.0 0.0 - 0.2 %   Neutrophils Relative % 53 %   Neutro Abs 3.7 1.7 - 7.7 K/uL   Lymphocytes Relative 35 %   Lymphs Abs  2.4 0.7 - 4.0 K/uL   Monocytes Relative 10 %   Monocytes Absolute 0.7 0.1 - 1.0 K/uL   Eosinophils Relative 1 %   Eosinophils Absolute 0.1 0.0 - 0.5 K/uL   Basophils Relative 1 %   Basophils Absolute 0.1 0.0 - 0.1 K/uL   Immature Granulocytes 0 %   Abs Immature Granulocytes 0.02 0.00 - 0.07 K/uL  Comment: Performed at [facility] Lab at [facility], [address] [place], [place] [address]  CMP (Cancer Center only)     Status: Abnormal   Collection Time: 03/04/22  8:03 AM  Result Value Ref Range   Sodium 140 135 - 145 mmol/L   Potassium 3.7 3.5 - 5.1 mmol/L   Chloride 103 98 - 111 mmol/L   CO2 26 22 - 32 mmol/L   Glucose, Bld 86 70 - 99 mg/dL    Comment: Glucose reference range applies only to samples taken after fasting for at least 8 hours.   BUN 18 6 - 20 mg/dL   Creatinine 6.04 5.40 - 1.24 mg/dL   Calcium 9.1 8.9 - 98.1 mg/dL   Total Protein 7.2 6.5 - 8.1 g/dL   Albumin 4.0 3.5 - 5.0 g/dL   AST 80 (H) 15 - 41 U/L   ALT 35 0 - 44 U/L   Alkaline Phosphatase 56 38 - 126 U/L   Total Bilirubin 0.5 0.3 - 1.2 mg/dL   GFR, Estimated >19 >14 mL/min    Comment: (NOTE) Calculated using the CKD-EPI Creatinine Equation (2021)    Anion gap 11 5 - 15    Comment: Performed at [facility] at [facility], [address] [place], [place] [address]  Reticulocytes     Status: Abnormal   Collection Time: 03/04/22  8:03 AM  Result Value Ref Range   Retic Ct Pct 1.9 0.4 - 3.1 %   RBC. 4.09 (L) 4.22 - 5.81 MIL/uL   Retic Count, Absolute 78.9 19.0 - 186.0 K/uL   Immature Retic Fract 17.1 (H) 2.3 - 15.9 %    Comment: Performed at [facility] Lab at [facility], [address] [place], [place] [address]  Save [facility])     Status: None   Collection Time: 03/04/22  8:03 AM  Result Value Ref Range   Smear Review SMEAR STAINED AND AVAILABLE FOR REVIEW     Comment: Performed at [facility] Lab at  [facility], [address], [place], [place] [address]      RADIOGRAPHY: No results found.     PATHOLOGY: None  ASSESSMENT/PLAN: Ms. Chaffins is a pleasant 51 yo gentleman with intermittent mild anemia over the the last 9 years.  His iron saturation is now 65% and ferritin pending. No infusion needed at this time.  Hgb is stable at 12.1, MCV 85, platelets 271 and WBC count 7.0.  Follow-up in 3 months.    All questions were answered. The patient knows to call the clinic with any problems, questions or concerns. We can certainly see the patient much sooner if necessary.  The patient was discussed with Dr. Myna Hidalgo and he is in agreement with the aforementioned.   Eileen Stanford, NP

## 2022-03-04 NOTE — Telephone Encounter (Signed)
I have called the pt to let him know that I have called the pharmacy and they have the medication ready for pick up. There was no answer so I left a message to call back.  ?

## 2022-03-04 NOTE — Telephone Encounter (Signed)
Medication: meloxicam (MOBIC) 15 MG tablet  ? ?Has the patient contacted their pharmacy? Yes.   ?(If no, request that the patient contact the pharmacy for the refill.) ?(If yes, when and what did the pharmacy advise?) ? ?Preferred Pharmacy (with phone number or street name):  ?[id number] [address] [place] [address] [id number] ?Phone:  [phone] Fax:  [phone] ? ?Agent: Please be advised that RX refills may take up to 3 business days. We ask that you follow-up with your pharmacy.  ?

## 2022-03-05 ENCOUNTER — Telehealth: Payer: Self-pay

## 2022-03-05 LAB — ERYTHROPOIETIN: Erythropoietin: 9.3 m[IU]/mL (ref 2.6–18.5)

## 2022-03-05 NOTE — Telephone Encounter (Signed)
-----   Message from Celso Amy, NP sent at 03/05/2022  2:20 PM EST ----- ?Labs are all stable! No intervention needed at this time. We will see him again in 3 months.  ? ?----- Message ----- ?From: Interface, [facility] [place] ?Sent: 03/04/2022   8:14 AM EST ?To: Celso Amy, NP ? ? ?

## 2022-03-05 NOTE — Telephone Encounter (Signed)
Called and informed patient of lab results, patient verbalized understanding and denies any questions or concerns at this time.   

## 2022-03-07 ENCOUNTER — Telehealth: Payer: Self-pay | Admitting: *Deleted

## 2022-03-07 NOTE — Telephone Encounter (Signed)
Per 03/04/22 los - called and gave upcoming appointments - confirmed ?

## 2022-04-08 ENCOUNTER — Other Ambulatory Visit: Payer: Self-pay | Admitting: Family

## 2022-05-06 ENCOUNTER — Ambulatory Visit: Payer: 59 | Admitting: Gastroenterology

## 2022-06-03 ENCOUNTER — Inpatient Hospital Stay: Payer: 59 | Admitting: Family

## 2022-06-03 ENCOUNTER — Inpatient Hospital Stay: Payer: 59 | Attending: Hematology & Oncology

## 2022-10-10 ENCOUNTER — Other Ambulatory Visit: Payer: Self-pay | Admitting: Family

## 2022-10-10 ENCOUNTER — Telehealth: Payer: Self-pay | Admitting: Family

## 2022-10-10 DIAGNOSIS — G8929 Other chronic pain: Secondary | ICD-10-CM

## 2022-10-10 NOTE — Telephone Encounter (Signed)
I have called the pt and informed him that we have sent the referral; however, since he was seen by Dr. Tamala Julian in the last couple of years he is still considered his pt. He needs to update his information and then give them a call to get scheduled. He stated understanding.

## 2022-10-10 NOTE — Telephone Encounter (Signed)
Pt was last seen on 01/14/22, are we able to place the referral to sports med or do we have to have him come in for the visit.

## 2022-10-10 NOTE — Telephone Encounter (Signed)
Patient states he has a new insurance and wants to know if we can send in the referral for sport med with the new insurance on file? He used to have [facility] now it is [facility]

## 2022-10-21 ENCOUNTER — Telehealth: Payer: Self-pay | Admitting: Family

## 2022-10-21 NOTE — Telephone Encounter (Signed)
Patient called to get a refill on his blood pressure and cholesterol pill- pt does not know the name of either medication. He is totally out. Advised that he may need to be seen since it's been several months. [id number] [address] [id number]: [phone] Fax: [phone]

## 2022-10-21 NOTE — Telephone Encounter (Signed)
Pt is out of medications, however he has not seen [place] since 12/2021. Please advise on refills and does he need an appointment? I have tried to call pt. No answer

## 2022-10-22 ENCOUNTER — Other Ambulatory Visit: Payer: Self-pay | Admitting: Family

## 2022-10-22 MED ORDER — AMLODIPINE BESYLATE 10 MG PO TABS: 10.0000 mg | ORAL_TABLET | Freq: Every day | ORAL | 1 refills | Status: DC

## 2022-10-22 MED ORDER — FENOFIBRATE 145 MG PO TABS: 145.0000 mg | ORAL_TABLET | Freq: Every day | ORAL | 1 refills | Status: DC

## 2022-10-23 NOTE — Telephone Encounter (Signed)
I have called pt and got him scheduled for the appointment.

## 2022-10-28 ENCOUNTER — Encounter: Payer: Self-pay | Admitting: Family

## 2022-10-28 ENCOUNTER — Ambulatory Visit (INDEPENDENT_AMBULATORY_CARE_PROVIDER_SITE_OTHER): Payer: 59 | Admitting: Family

## 2022-10-28 VITALS — BP 140/82 | HR 86 | Temp 98.0°F | Ht 70.0 in | Wt 212.2 lb

## 2022-10-28 DIAGNOSIS — I10 Essential (primary) hypertension: Secondary | ICD-10-CM | POA: Diagnosis not present

## 2022-10-28 DIAGNOSIS — E785 Hyperlipidemia, unspecified: Secondary | ICD-10-CM | POA: Diagnosis not present

## 2022-10-28 DIAGNOSIS — Z1211 Encounter for screening for malignant neoplasm of colon: Secondary | ICD-10-CM | POA: Diagnosis not present

## 2022-10-28 DIAGNOSIS — D649 Anemia, unspecified: Secondary | ICD-10-CM

## 2022-10-28 LAB — CBC WITH DIFFERENTIAL/PLATELET
Basophils Absolute: 0 10*3/uL (ref 0.0–0.1)
Basophils Relative: 0.4 % (ref 0.0–3.0)
Eosinophils Absolute: 0.1 10*3/uL (ref 0.0–0.7)
Eosinophils Relative: 1.3 % (ref 0.0–5.0)
HCT: 36.4 % — ABNORMAL LOW (ref 39.0–52.0)
Hemoglobin: 12.1 g/dL — ABNORMAL LOW (ref 13.0–17.0)
Lymphocytes Relative: 25.4 % (ref 12.0–46.0)
Lymphs Abs: 2.5 10*3/uL (ref 0.7–4.0)
MCHC: 33.3 g/dL (ref 30.0–36.0)
MCV: 89.2 fl (ref 78.0–100.0)
Monocytes Absolute: 0.9 10*3/uL (ref 0.1–1.0)
Monocytes Relative: 8.7 % (ref 3.0–12.0)
Neutro Abs: 6.2 10*3/uL (ref 1.4–7.7)
Neutrophils Relative %: 64.2 % (ref 43.0–77.0)
Platelets: 299 10*3/uL (ref 150.0–400.0)
RBC: 4.08 Mil/uL — ABNORMAL LOW (ref 4.22–5.81)
RDW: 16.4 % — ABNORMAL HIGH (ref 11.5–15.5)
WBC: 9.7 10*3/uL (ref 4.0–10.5)

## 2022-10-28 MED ORDER — AMLODIPINE BESYLATE 10 MG PO TABS: 10.0000 mg | ORAL_TABLET | Freq: Every day | ORAL | 3 refills | Status: DC

## 2022-10-28 MED ORDER — FENOFIBRATE 145 MG PO TABS: 145.0000 mg | ORAL_TABLET | Freq: Every day | ORAL | 3 refills | Status: DC

## 2022-10-28 NOTE — Progress Notes (Signed)
Tony Robertson. is a 51 y.o. male with the following history as recorded in EpicCare:  Patient Active Problem List   Diagnosis Date Noted   Bilateral low back pain without sciatica 10/20/2019   Routine general medical examination at a health care facility 11/02/2012   Other abnormal glucose 11/02/2012   Essential hypertension, benign 11/02/2012   Shifting sleep-work schedule, affecting sleep 04/16/2011   Hypertriglyceridemia 03/26/2011   Tobacco abuse 03/26/2011   SLEEP APNEA 03/12/2011    Current Outpatient Medications  Medication Sig Dispense Refill   amLODipine (NORVASC) 10 MG tablet Take 1 tablet (10 mg total) by mouth daily. 90 tablet 3   fenofibrate (TRICOR) 145 MG tablet Take 1 tablet (145 mg total) by mouth daily. 90 tablet 3   No current facility-administered medications for this visit.    Allergies: Penicillins  Past Medical History:  Diagnosis Date   Anemia    High cholesterol    Hypertension    Tobacco abuse     Past Surgical History:  Procedure Laterality Date   NO PAST SURGERIES      Family History  Problem Relation Age of Onset   Hyperlipidemia Father    Heart disease Father    Hypertension Father    Rheum arthritis Mother    Hypertension Mother    Hypertension Sister    Hypertension Other    Cancer Maternal Aunt    Cancer Maternal Grandmother    Asthma Cousin     Social History   Tobacco Use   Smoking status: Every Day    Packs/day: 0.50    Years: 20.00    Total pack years: 10.00    Types: Cigarettes   Smokeless tobacco: Never  Substance Use Topics   Alcohol use: Yes    Alcohol/week: 10.0 standard drinks of alcohol    Types: 5 Cans of beer, 5 Shots of liquor per week    Comment: 2-3 beers a day    Subjective:  Patient presents to follow up on chronic care needs; history of hypertension/ hyperlipidemia;  Admits has not been taking his medication regularly; Denies any chest pain, shortness of breath, blurred vision or  headache     Objective:  Vitals:   10/28/22 0816  BP: (!) 144/82  Pulse: 86  Temp: 98 F (36.7 C)  TempSrc: Oral  SpO2: 98%  Weight: 212 lb 3.2 oz (96.3 kg)  Height: '5\' 10"'$  (1.778 m)    General: Well developed, well nourished, in no acute distress  Skin : Warm and dry.  Head: Normocephalic and atraumatic  Eyes: Sclera and conjunctiva clear; pupils round and reactive to light; extraocular movements intact  Ears: External normal; canals clear; tympanic membranes normal  Oropharynx: Pink, supple. No suspicious lesions  Neck: Supple without thyromegaly, adenopathy  Lungs: Respirations unlabored; clear to auscultation bilaterally without wheeze, rales, rhonchi  CVS exam: normal rate and regular rhythm.  Neurologic: Alert and oriented; speech intact; face symmetrical; moves all extremities well; CNII-XII intact without focal deficit   Assessment:  1. Essential hypertension, benign   2. Encounter for screening colonoscopy   3. Hyperlipidemia, unspecified hyperlipidemia type   4. Anemia, unspecified type     Plan:  Uncontrolled- not taking medication; refills updated- stressed need to take daily;  Referral updated; Refill updated- has not been on medication; will plan to update labs in 6 months; Update labs- has not been able to follow up with hematology;   Return in about 6 months (around 04/28/2023).  Orders  Placed This Encounter  Procedures   CBC with Differential/Platelet   Iron, TIBC and Ferritin Panel   Ambulatory referral to Gastroenterology    Referral Priority:   Routine    Referral Type:   Consultation    Referral Reason:   Specialty Services Required    Number of Visits Requested:   1    Requested Prescriptions   Signed Prescriptions Disp Refills   amLODipine (NORVASC) 10 MG tablet 90 tablet 3    Sig: Take 1 tablet (10 mg total) by mouth daily.   fenofibrate (TRICOR) 145 MG tablet 90 tablet 3    Sig: Take 1 tablet (145 mg total) by mouth daily.

## 2022-10-29 LAB — IRON,TIBC AND FERRITIN PANEL
%SAT: 27 % (calc) (ref 20–48)
Ferritin: 656 ng/mL — ABNORMAL HIGH (ref 38–380)
Iron: 120 ug/dL (ref 50–180)
TIBC: 437 mcg/dL (calc) — ABNORMAL HIGH (ref 250–425)

## 2022-10-30 ENCOUNTER — Other Ambulatory Visit: Payer: Self-pay | Admitting: Family

## 2022-10-30 DIAGNOSIS — R7989 Other specified abnormal findings of blood chemistry: Secondary | ICD-10-CM

## 2022-11-04 ENCOUNTER — Ambulatory Visit (HOSPITAL_BASED_OUTPATIENT_CLINIC_OR_DEPARTMENT_OTHER)
Admission: RE | Admit: 2022-11-04 | Discharge: 2022-11-04 | Disposition: A | Discharge status: Home / Self Care | Payer: 59 | Source: Ambulatory Visit | Attending: Family | Admitting: Family

## 2022-11-04 ENCOUNTER — Other Ambulatory Visit: Payer: Self-pay

## 2022-11-04 ENCOUNTER — Encounter: Payer: Self-pay | Admitting: Family

## 2022-11-04 ENCOUNTER — Inpatient Hospital Stay (HOSPITAL_BASED_OUTPATIENT_CLINIC_OR_DEPARTMENT_OTHER): Payer: 59 | Admitting: Family

## 2022-11-04 ENCOUNTER — Inpatient Hospital Stay: Payer: 59 | Attending: Hematology & Oncology

## 2022-11-04 VITALS — BP 146/96 | HR 96 | Temp 98.3°F | Resp 18 | Ht 70.0 in | Wt 211.8 lb

## 2022-11-04 DIAGNOSIS — Z880 Allergy status to penicillin: Secondary | ICD-10-CM | POA: Insufficient documentation

## 2022-11-04 DIAGNOSIS — D509 Iron deficiency anemia, unspecified: Secondary | ICD-10-CM | POA: Insufficient documentation

## 2022-11-04 DIAGNOSIS — Z79899 Other long term (current) drug therapy: Secondary | ICD-10-CM | POA: Insufficient documentation

## 2022-11-04 DIAGNOSIS — D649 Anemia, unspecified: Secondary | ICD-10-CM | POA: Diagnosis not present

## 2022-11-04 DIAGNOSIS — M545 Low back pain, unspecified: Secondary | ICD-10-CM | POA: Diagnosis present

## 2022-11-04 DIAGNOSIS — G8929 Other chronic pain: Secondary | ICD-10-CM | POA: Diagnosis present

## 2022-11-04 LAB — CBC WITH DIFFERENTIAL (CANCER CENTER ONLY)
Abs Immature Granulocytes: 0.09 10*3/uL — ABNORMAL HIGH (ref 0.00–0.07)
Basophils Absolute: 0.1 10*3/uL (ref 0.0–0.1)
Basophils Relative: 1 %
Eosinophils Absolute: 0.1 10*3/uL (ref 0.0–0.5)
Eosinophils Relative: 1 %
HCT: 38.4 % — ABNORMAL LOW (ref 39.0–52.0)
Hemoglobin: 13 g/dL (ref 13.0–17.0)
Immature Granulocytes: 1 %
Lymphocytes Relative: 30 %
Lymphs Abs: 2.7 10*3/uL (ref 0.7–4.0)
MCH: 29.1 pg (ref 26.0–34.0)
MCHC: 33.9 g/dL (ref 30.0–36.0)
MCV: 85.9 fL (ref 80.0–100.0)
Monocytes Absolute: 0.9 10*3/uL (ref 0.1–1.0)
Monocytes Relative: 10 %
Neutro Abs: 5.2 10*3/uL (ref 1.7–7.7)
Neutrophils Relative %: 57 %
Platelet Count: 275 10*3/uL (ref 150–400)
RBC: 4.47 MIL/uL (ref 4.22–5.81)
RDW: 15.2 % (ref 11.5–15.5)
WBC Count: 9 10*3/uL (ref 4.0–10.5)
nRBC: 0 % (ref 0.0–0.2)

## 2022-11-04 LAB — CMP (CANCER CENTER ONLY)
ALT: 30 U/L (ref 0–44)
AST: 51 U/L — ABNORMAL HIGH (ref 15–41)
Albumin: 4.8 g/dL (ref 3.5–5.0)
Alkaline Phosphatase: 80 U/L (ref 38–126)
Anion gap: 11 (ref 5–15)
BUN: 12 mg/dL (ref 6–20)
CO2: 28 mmol/L (ref 22–32)
Calcium: 10.5 mg/dL — ABNORMAL HIGH (ref 8.9–10.3)
Chloride: 97 mmol/L — ABNORMAL LOW (ref 98–111)
Creatinine: 0.93 mg/dL (ref 0.61–1.24)
GFR, Estimated: >60 mL/min (ref >60)
Glucose, Bld: 112 mg/dL — ABNORMAL HIGH (ref 70–99)
Potassium: 4.2 mmol/L (ref 3.5–5.1)
Sodium: 136 mmol/L (ref 135–145)
Total Bilirubin: 0.8 mg/dL (ref 0.3–1.2)
Total Protein: 8.6 g/dL — ABNORMAL HIGH (ref 6.5–8.1)

## 2022-11-04 LAB — LACTATE DEHYDROGENASE: LDH: 198 U/L — ABNORMAL HIGH (ref 98–192)

## 2022-11-04 LAB — RETICULOCYTES
Immature Retic Fract: 20.5 % — ABNORMAL HIGH (ref 2.3–15.9)
RBC.: 4.53 MIL/uL (ref 4.22–5.81)
Retic Count, Absolute: 100.1 K/uL (ref 19.0–186.0)
Retic Ct Pct: 2.2 % (ref 0.4–3.1)

## 2022-11-04 NOTE — Progress Notes (Signed)
Hematology and Oncology Follow Up Visit  Tony Robertson [id number] [birth year] 51 y.o. 11/04/2022   Principle Diagnosis:  Intermittent iron deficiency anemia   Current Therapy:   Observation   Interim History:  Tony Robertson is here today with his wife for follow-up. He is doing well and has no complaints at this time.  No issue with blood loss. No bruising or petechiae.  Iron saturation last week was 27% and ferritin 656.  Hgb is stable at 13.0, MCV 85, platelets 275 and WBC count 9.0.  He states that he has been scheduled for a colonoscopy before the end of the year.  No fever, chills, n/v, cough, rash, dizziness, SOB, chest pain, palpitations, abdominal pain or changes in bowel or bladder habits.  No swelling, numbness or tingling in his extremities.  He does have chronic lower back pain and has an appointment this week with sport medicine. Xray of the lumbar spine this morning shows some DDD.  No falls or syncope reported.  Appetite and hydration have been good. Weight is stable at 211 lbs.   ECOG Performance Status: 1 - Symptomatic but completely ambulatory  Medications:  Allergies as of 11/04/2022       Reactions   Penicillins Other (See Comments)   Unknown_childhood reaction Has patient had a PCN reaction causing immediate rash, facial/tongue/throat swelling, SOB or lightheadedness with hypotension: unknown Has patient had a PCN reaction causing severe rash involving mucus membranes or skin necrosis: unknown Has patient had a PCN reaction that required hospitalization No Has patient had a PCN reaction occurring within the last 10 years: No If all of the above answers are "NO", then may proceed with Cephalosporin use.        Medication List        Accurate as of November 04, 2022  9:17 AM. If you have any questions, ask your nurse or doctor.          amLODipine 10 MG tablet Commonly known as: NORVASC Take 1 tablet (10 mg total) by mouth daily.    fenofibrate 145 MG tablet Commonly known as: TRICOR Take 1 tablet (145 mg total) by mouth daily.        Allergies:  Allergies  Allergen Reactions   Penicillins Other (See Comments)    Unknown_childhood reaction Has patient had a PCN reaction causing immediate rash, facial/tongue/throat swelling, SOB or lightheadedness with hypotension: unknown Has patient had a PCN reaction causing severe rash involving mucus membranes or skin necrosis: unknown Has patient had a PCN reaction that required hospitalization No Has patient had a PCN reaction occurring within the last 10 years: No If all of the above answers are "NO", then may proceed with Cephalosporin use.     Past Medical History, Surgical history, Social history, and Family History were reviewed and updated.  Review of Systems: All other 10 point review of systems is negative.   Physical Exam:  height is '5\' 10"'$  (1.778 m) and weight is 211 lb 12.8 oz (96.1 kg). His oral temperature is 98.3 F (36.8 C). His blood pressure is 146/96 (abnormal) and his pulse is 96. His respiration is 18 and oxygen saturation is 100%.   Wt Readings from Last 3 Encounters:  11/04/22 211 lb 12.8 oz (96.1 kg)  10/28/22 212 lb 3.2 oz (96.3 kg)  03/04/22 209 lb 6.4 oz (95 kg)    Ocular: Sclerae unicteric, pupils equal, round and reactive to light Ear-nose-throat: Oropharynx clear, dentition fair Lymphatic: No cervical or supraclavicular adenopathy  Lungs no rales or rhonchi, good excursion bilaterally Heart regular rate and rhythm, no murmur appreciated Abd soft, nontender, positive bowel sounds MSK no focal spinal tenderness, no joint edema Neuro: non-focal, well-oriented, appropriate affect Breasts: Deferred   Lab Results  Component Value Date   WBC 9.0 11/04/2022   HGB 13.0 11/04/2022   HCT 38.4 (L) 11/04/2022   MCV 85.9 11/04/2022   PLT 275 11/04/2022   Lab Results  Component Value Date   FERRITIN 656 (H) 10/28/2022   IRON 120  10/28/2022   TIBC 437 (H) 10/28/2022   UIBC 178 03/04/2022   IRONPCTSAT 27 10/28/2022   Lab Results  Component Value Date   RETICCTPCT 2.2 11/04/2022   RBC 4.47 11/04/2022   RBC 4.53 11/04/2022   No results found for: "KPAFRELGTCHN", "LAMBDASER", "KAPLAMBRATIO" Lab Results  Component Value Date   IGA 411 (H) 02/10/2019   No results found for: "TOTALPROTELP", "ALBUMINELP", "A1GS", "A2GS", "BETS", "BETA2SER", "GAMS", "MSPIKE", "SPEI"   Chemistry      Component Value Date/Time   NA 140 03/04/2022 0803   K 3.7 03/04/2022 0803   CL 103 03/04/2022 0803   CO2 26 03/04/2022 0803   BUN 18 03/04/2022 0803   CREATININE 1.06 03/04/2022 0803      Component Value Date/Time   CALCIUM 9.1 03/04/2022 0803   ALKPHOS 56 03/04/2022 0803   AST 80 (H) 03/04/2022 0803   ALT 35 03/04/2022 0803   BILITOT 0.5 03/04/2022 0803       Impression and Plan: Tony Robertson is a pleasant 51 yo gentleman with intermittent mild anemia.  Iron studies have remained stable.  At this time we will release him back to his PCP. We can follow-up as needed.  Patient can contact our office with any questions or concerns.    Lottie Dawson, NP 11/7/20239:17 AM

## 2022-11-14 ENCOUNTER — Ambulatory Visit: Payer: Self-pay | Admitting: Family Medicine

## 2022-11-17 NOTE — Progress Notes (Deleted)
    Tony Robertson D.[place] Phone: [phone]   Assessment and Plan:     There are no diagnoses linked to this encounter.  ***   Pertinent previous records reviewed include ***   Follow Up: ***     Subjective:   I, Teea Ducey, am serving as a Education administrator for [employer]  Chief Complaint: low back pain   HPI:   12/16/2019 Continues to have the back pain.  X-rays ordered today to further evaluate.  Muscle relaxer does not seem to be helping and change to gabapentin at nighttime.  Patient could continue with some light duty at work but he would like fully released and he will adjust accordingly.  Discussed with patient possible osteopathic manipulation in the long run could be beneficial.  Discussed which activities to avoid.  Follow-up again in 4 to 8 weeks   Update 10/09/2020 Tony Robertson. is a 51 y.o. male coming in with complaint of low back pain. Patient states his back pain has gotten worse. Mid back. 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 States the pain radiates to his legs and neck. ADLs causing pain. Pain at night. 8/10 at its worse.  Patient states that the meloxicam made him feel woozy.  Patient was last seen in December and was lost to follow-up with noncompliance.   Patient did have x-rays last time that did show multilevel degenerative disc disease of the lumbar spine.  11/18/2022 Patient states  Additional pertinent review of systems negative.   Current Outpatient Medications:    amLODipine (NORVASC) 10 MG tablet, Take 1 tablet (10 mg total) by mouth daily., Disp: 90 tablet, Rfl: 3   fenofibrate (TRICOR) 145 MG tablet, Take 1 tablet (145 mg total) by mouth daily., Disp: 90 tablet, Rfl: 3   Objective:     There were no vitals filed for this visit.    There is no height or weight on file to calculate BMI.    Physical Exam:    ***   Electronically signed by:  Tony Robertson D.[facility] 7:22 AM 11/17/22

## 2022-11-18 ENCOUNTER — Ambulatory Visit: Payer: 59 | Admitting: Sports Medicine

## 2022-11-25 ENCOUNTER — Ambulatory Visit: Payer: 59 | Admitting: Sports Medicine

## 2022-11-27 ENCOUNTER — Ambulatory Visit (AMBULATORY_SURGERY_CENTER): Payer: Self-pay | Admitting: *Deleted

## 2022-11-27 VITALS — Ht 70.0 in | Wt 209.4 lb

## 2022-11-27 DIAGNOSIS — Z1211 Encounter for screening for malignant neoplasm of colon: Secondary | ICD-10-CM

## 2022-11-27 MED ORDER — NA SULFATE-K SULFATE-MG SULF 17.5-3.13-1.6 GM/177ML PO SOLN: 1.0000 | Freq: Once | ORAL | 0 refills | Status: AC

## 2022-11-27 NOTE — Addendum Note (Signed)
Addended by: Etheleen Nicks on: 11/27/2022 11:07 AM   Modules accepted: Level of Service

## 2022-11-27 NOTE — Progress Notes (Signed)
No egg or soy allergy known to patient  No issues known to pt with past sedation with any surgeries or procedures Patient denies ever being told they had issues or difficulty with intubation No hx of this No FH of Malignant Hyperthermia Pt is not on diet pills Pt is not on  home 02  Pt is not on blood thinners  Pt denies issues with constipation  Pt encouraged to use to use Singlecare or Goodrx to reduce cost  Coupon gvien

## 2022-12-11 ENCOUNTER — Encounter: Payer: Self-pay | Admitting: Gastroenterology

## 2022-12-16 ENCOUNTER — Encounter: Payer: Self-pay | Admitting: Gastroenterology

## 2022-12-16 ENCOUNTER — Ambulatory Visit (AMBULATORY_SURGERY_CENTER): Payer: 59 | Admitting: Gastroenterology

## 2022-12-16 VITALS — BP 123/89 | HR 69 | Temp 99.5°F | Resp 15 | Ht 70.0 in | Wt 209.4 lb

## 2022-12-16 DIAGNOSIS — D120 Benign neoplasm of cecum: Secondary | ICD-10-CM | POA: Diagnosis not present

## 2022-12-16 DIAGNOSIS — Z1211 Encounter for screening for malignant neoplasm of colon: Secondary | ICD-10-CM

## 2022-12-16 DIAGNOSIS — D123 Benign neoplasm of transverse colon: Secondary | ICD-10-CM | POA: Diagnosis not present

## 2022-12-16 MED ORDER — SODIUM CHLORIDE 0.9 % IV SOLN: 500.0000 mL | Freq: Once | INTRAVENOUS | Status: DC

## 2022-12-16 NOTE — Progress Notes (Signed)
Pt's states no medical or surgical changes since previsit or office visit. 

## 2022-12-16 NOTE — Progress Notes (Signed)
History & Physical  Primary Care Physician:  [name], FNP Primary Gastroenterologist: Lucio Edward, MD  CHIEF COMPLAINT:  CRC screening  HPI: Tony Robertson. is a 51 y.o. male average risk CRC screening for colonoscopy.   Past Medical History:  Diagnosis Date   Anemia    High cholesterol    Hypertension    Tobacco abuse     Past Surgical History:  Procedure Laterality Date   NO PAST SURGERIES      Prior to Admission medications   Medication Sig Start Date End Date Taking? Authorizing Provider  amLODipine (NORVASC) 10 MG tablet Take 1 tablet (10 mg total) by mouth daily. 10/28/22  Yes [name], FNP  fenofibrate (TRICOR) 145 MG tablet Take 1 tablet (145 mg total) by mouth daily. 10/28/22  Yes [name], FNP    Current Outpatient Medications  Medication Sig Dispense Refill   amLODipine (NORVASC) 10 MG tablet Take 1 tablet (10 mg total) by mouth daily. 90 tablet 3   fenofibrate (TRICOR) 145 MG tablet Take 1 tablet (145 mg total) by mouth daily. 90 tablet 3   Current Facility-Administered Medications  Medication Dose Route Frequency Provider Last Rate Last Admin   0.9 %  sodium chloride infusion  500 mL Intravenous Once Ladene Artist, MD        Allergies as of 12/16/2022 - Review Complete 12/16/2022  Allergen Reaction Noted   Penicillins Other (See Comments) 04/22/2011    Family History  Problem Relation Age of Onset   Rheum arthritis Mother    Hypertension Mother    Colon polyps Father    Hyperlipidemia Father    Heart disease Father    Hypertension Father    Hypertension Sister    Cancer Maternal Aunt    Cancer Maternal Grandmother    Asthma Cousin    Hypertension Other    Colon cancer Neg Hx    Esophageal cancer Neg Hx    Rectal cancer Neg Hx    Stomach cancer Neg Hx     Social History   Socioeconomic History   Marital status: Single    Spouse name: Not on file   Number of children: 1   Years of  education: Not on file   Highest education level: Not on file  Occupational History   Occupation: Agricultural consultant: [employer]  Tobacco Use   Smoking status: Every Day    Packs/day: 0.25    Years: 20.00    Total pack years: 5.00    Types: Cigarettes   Smokeless tobacco: Never  Vaping Use   Vaping Use: Never used  Substance and Sexual Activity   Alcohol use: Yes    Alcohol/week: 10.0 standard drinks of alcohol    Types: 5 Cans of beer, 5 Shots of liquor per week    Comment: 2-3 beers a day   Drug use: No   Sexual activity: Yes  Other Topics Concern   Not on file  Social History Narrative   Regular Exercise -  YES         Social Determinants of Health   Financial Resource Strain: Not on file  Food Insecurity: Not on file  Transportation Needs: Not on file  Physical Activity: Not on file  Stress: Not on file  Social Connections: Not on file  Intimate Partner Violence: Not on file    Review of Systems:  All systems reviewed were negative except where noted in HPI.  Physical Exam: General:  Alert, well-developed, in NAD Head:  Normocephalic and atraumatic. Eyes:  Sclera clear, no icterus.   Conjunctiva pink. Ears:  Normal auditory acuity. Mouth:  No deformity or lesions.  Neck:  Supple; no masses . Lungs:  Clear throughout to auscultation.   No wheezes, crackles, or rhonchi. No acute distress. Heart:  Regular rate and rhythm; no murmurs. Abdomen:  Soft, nondistended, nontender. No masses, hepatomegaly. No obvious masses.  Normal bowel .    Rectal:  Deferred   Msk:  Symmetrical without gross deformities.. Pulses:  Normal pulses noted. Extremities:  Without edema. Neurologic:  Alert and  oriented x4;  grossly normal neurologically. Skin:  Intact without significant lesions or rashes. Psych:  Alert and cooperative. Normal mood and affect.   Impression / Plan:   Average risk CRC screening for colonoscopy.  Tony Robertson. Fuller Plan  12/16/2022, 10:19  AM See Shea Evans, [facility], to contact our on call provider

## 2022-12-16 NOTE — Op Note (Signed)
[facility] Patient Name: Tony Robertson Procedure Date: 12/16/2022 10:17 AM MRN: [id number] Endoscopist: Ladene Artist , MD, [id number] Age: 51 Referring MD:  Date of Birth: [birth year] Gender: Male Account #: [id number] Procedure:                Colonoscopy Indications:              Screening for colorectal malignant neoplasm Medicines:                Monitored Anesthesia Care Procedure:                Pre-Anesthesia Assessment:                           - Prior to the procedure, a History and Physical                            was performed, and patient medications and                            allergies were reviewed. The patient's tolerance of                            previous anesthesia was also reviewed. The risks                            and benefits of the procedure and the sedation                            options and risks were discussed with the patient.                            All questions were answered, and informed consent                            was obtained. Prior Anticoagulants: The patient has                            taken no anticoagulant or antiplatelet agents. ASA                            Grade Assessment: II - A patient with mild systemic                            disease. After reviewing the risks and benefits,                            the patient was deemed in satisfactory condition to                            undergo the procedure.                           After obtaining informed consent, the colonoscope  was passed under direct vision. Throughout the                            procedure, the patient's blood pressure, pulse, and                            oxygen saturations were monitored continuously. The                            Olympus CF-HQ190L ([id number]) Colonoscope was                            introduced through the anus and advanced to the the                            cecum,  identified by appendiceal orifice and                            ileocecal valve. The ileocecal valve, appendiceal                            orifice, and rectum were photographed. The quality                            of the bowel preparation was good. The colonoscopy                            was performed without difficulty. The patient                            tolerated the procedure well. Scope In: 10:25:50 AM Scope Out: 10:36:42 AM Scope Withdrawal Time: 0 hours 9 minutes 20 seconds  Total Procedure Duration: 0 hours 10 minutes 52 seconds  Findings:                 The perianal and digital rectal examinations were                            normal.                           Two sessile polyps were found in the transverse                            colon and cecum. The polyps were 5 to 8 mm in size.                            These polyps were removed with a cold snare.                            Resection and retrieval were complete.                           Internal hemorrhoids were found during  retroflexion. The hemorrhoids were small and Grade                            I (internal hemorrhoids that do not prolapse).                           The exam was otherwise without abnormality on                            direct and retroflexion views. Complications:            No immediate complications. Estimated blood loss:                            None. Estimated Blood Loss:     Estimated blood loss: none. Impression:               - Two 5 to 8 mm polyps in the transverse colon and                            in the cecum, removed with a cold snare. Resected                            and retrieved.                           - Internal hemorrhoids.                           - Otherwise normal appearing colonoscopy. Recommendation:           - Repeat colonoscopy after studies are complete for                            surveillance based on pathology  results.                           - Patient has a contact number available for                            emergencies. The signs and symptoms of potential                            delayed complications were discussed with the                            patient. Return to normal activities tomorrow.                            Written discharge instructions were provided to the                            patient.                           - Resume previous diet.                           -  Continue present medications.                           - Await pathology results. Ladene Artist, MD 12/16/2022 10:46:14 AM This report has been signed electronically.

## 2022-12-16 NOTE — Progress Notes (Signed)
Sedate, gd SR, tolerated procedure well, VSS, report to RN 

## 2022-12-16 NOTE — Patient Instructions (Addendum)
Handouts Provided:  Polyps  - Repeat colonoscopy after studies are complete for surveillance based on pathology results. - Patient has a contact number available for emergencies. The signs and symptoms of potential delayed complications were discussed with the patient. Return to normal activities tomorrow. Written discharge instructions were provided to the patient. - Resume previous diet. - Continue present medications. - Await pathology results.  YOU HAD AN ENDOSCOPIC PROCEDURE TODAY AT [facility]:   Refer to the procedure report that was given to you for any specific questions about what was found during the examination.  If the procedure report does not answer your questions, please call your gastroenterologist to clarify.  If you requested that your care partner not be given the details of your procedure findings, then the procedure report has been included in a sealed envelope for you to review at your convenience later.  YOU SHOULD EXPECT: Some feelings of bloating in the abdomen. Passage of more gas than usual.  Walking can help get rid of the air that was put into your GI tract during the procedure and reduce the bloating. If you had a lower endoscopy (such as a colonoscopy or flexible sigmoidoscopy) you may notice spotting of blood in your stool or on the toilet paper. If you underwent a bowel prep for your procedure, you may not have a normal bowel movement for a few days.  Please Note:  You might notice some irritation and congestion in your nose or some drainage.  This is from the oxygen used during your procedure.  There is no need for concern and it should clear up in a day or so.  SYMPTOMS TO REPORT IMMEDIATELY:  Following lower endoscopy (colonoscopy or flexible sigmoidoscopy):  Excessive amounts of blood in the stool  Significant tenderness or worsening of abdominal pains  Swelling of the abdomen that is new, acute  Fever of 100F or higher  For urgent or  emergent issues, a gastroenterologist can be reached at any hour by calling [phone]. Do not use MyChart messaging for urgent concerns.    DIET:  We do recommend a small meal at first, but then you may proceed to your regular diet.  Drink plenty of fluids but you should avoid alcoholic beverages for 24 hours.  ACTIVITY:  You should plan to take it easy for the rest of today and you should NOT DRIVE or use heavy machinery until tomorrow (because of the sedation medicines used during the test).    FOLLOW UP: Our staff will call the number listed on your records the next business day following your procedure.  We will call around 7:15- 8:00 am to check on you and address any questions or concerns that you may have regarding the information given to you following your procedure. If we do not reach you, we will leave a message.     If any biopsies were taken you will be contacted by phone or by letter within the next 1-3 weeks.  Please call us at [phone] if you have not heard about the biopsies in 3 weeks.    SIGNATURES/CONFIDENTIALITY: You and/or your care partner have signed paperwork which will be entered into your electronic medical record.  These signatures attest to the fact that that the information above on your After Visit Summary has been reviewed and is understood.  Full responsibility of the confidentiality of this discharge information lies with you and/or your care-partner.

## 2022-12-17 ENCOUNTER — Telehealth: Payer: Self-pay

## 2022-12-17 NOTE — Telephone Encounter (Signed)
  Follow up Call-     12/16/2022    9:41 AM  Call back number  Post procedure Call Back phone  # [phone]  Permission to leave phone message Yes     Patient questions:  Do you have a fever, pain , or abdominal swelling? No. Pain Score  0 *  Have you tolerated food without any problems? Yes.    Have you been able to return to your normal activities? Yes.    Do you have any questions about your discharge instructions: Diet   No. Medications  No. Follow up visit  No.  Do you have questions or concerns about your Care? No.  Actions: * If pain score is 4 or above: No action needed, pain <4.

## 2023-01-01 ENCOUNTER — Ambulatory Visit: Payer: 59 | Admitting: Sports Medicine

## 2023-01-05 ENCOUNTER — Encounter: Payer: Self-pay | Admitting: Gastroenterology

## 2023-01-26 NOTE — Progress Notes (Deleted)
Tony Robertson Tony Robertson [place] Phone: [phone]   Assessment and Plan:     There are no diagnoses linked to this encounter.  ***   Pertinent previous records reviewed include ***   Follow Up: ***     Subjective:   I, Tony Robertson, am serving as a Education administrator for Tony Robertson  Chief Complaint: low back pain   HPI:   01/27/2023 Patient is a 52 year old male complaining of low back pain. Patient states  Relevant Historical Information: ***  Additional pertinent review of systems negative.   Current Outpatient Medications:    amLODipine (NORVASC) 10 MG tablet, Take 1 tablet (10 mg total) by mouth daily., Disp: 90 tablet, Rfl: 3   fenofibrate (TRICOR) 145 MG tablet, Take 1 tablet (145 mg total) by mouth daily., Disp: 90 tablet, Rfl: 3   Objective:     There were no vitals filed for this visit.    There is no height or weight on file to calculate BMI.    Physical Exam:    ***   Electronically signed by:  Tony Robertson D.[facility] 11:46 AM 01/26/23

## 2023-01-27 ENCOUNTER — Ambulatory Visit: Payer: 59 | Admitting: Sports Medicine

## 2023-05-21 ENCOUNTER — Telehealth: Payer: Self-pay | Admitting: Family

## 2023-05-21 ENCOUNTER — Other Ambulatory Visit: Payer: Self-pay | Admitting: Family

## 2023-05-21 MED ORDER — FENOFIBRATE 145 MG PO TABS: 145.0000 mg | ORAL_TABLET | Freq: Every day | ORAL | 3 refills | Status: DC

## 2023-05-21 NOTE — Telephone Encounter (Signed)
Prescription Request  05/21/2023  Is this a "Controlled Substance" medicine? No  LOV: Visit date not found  ---- Pt's significant other advised pt will need to schedule appt when he's back in town  What is the name of the medication or equipment?  fenofibrate (TRICOR) 145 MG tablet   Have you contacted your pharmacy to request a refill? No   Which pharmacy would you like this sent to?  [id number] [place], [place] - [address] [place] [address] Phone: [phone] Fax: [phone]    Patient notified that their request is being sent to the clinical staff for review and that they should receive a response within 2 business days.   Please advise at Mobile [phone] (mobile)

## 2023-08-11 ENCOUNTER — Encounter: Payer: 59 | Admitting: Family

## 2023-08-19 ENCOUNTER — Telehealth: Payer: Self-pay | Admitting: Family

## 2023-08-19 ENCOUNTER — Other Ambulatory Visit: Payer: Self-pay | Admitting: Family

## 2023-08-19 MED ORDER — AMLODIPINE BESYLATE 10 MG PO TABS: 10.0000 mg | ORAL_TABLET | Freq: Every day | ORAL | 0 refills | Status: DC

## 2023-08-19 NOTE — Telephone Encounter (Signed)
Prescription Request  08/19/2023  Is this a "Controlled Substance" medicine? No  LOV: Visit date not found  What is the name of the medication or equipment? amLODipine (NORVASC) 10 MG tablet  Have you contacted your pharmacy to request a refill? No   Which pharmacy would you like this sent to?  [id number] [place],  - [address] [place] [address] Phone: [phone] Fax: [phone]    Patient notified that their request is being sent to the clinical staff for review and that they should receive a response within 2 business days.   Please advise at [facility] [place] [phone]

## 2023-08-20 MED ORDER — FENOFIBRATE 145 MG PO TABS: 145.0000 mg | ORAL_TABLET | Freq: Every day | ORAL | 3 refills | Status: DC

## 2023-08-20 NOTE — Telephone Encounter (Signed)
Rx has been sent in, spoke with pt. Pt is aware and expressed understanding.

## 2023-08-20 NOTE — Addendum Note (Signed)
Addended by: Judieth Keens on: 08/20/2023 08:36 AM   Modules accepted: Orders

## 2023-08-20 NOTE — Telephone Encounter (Signed)
Pt would also like a refill on this.     fenofibrate (TRICOR) 145 MG tablet

## 2023-08-27 ENCOUNTER — Encounter: Payer: 59 | Admitting: Family

## 2023-09-24 ENCOUNTER — Encounter: Payer: Self-pay | Admitting: Family

## 2023-09-24 ENCOUNTER — Other Ambulatory Visit: Payer: Self-pay | Admitting: Family

## 2023-09-24 ENCOUNTER — Ambulatory Visit (INDEPENDENT_AMBULATORY_CARE_PROVIDER_SITE_OTHER): Payer: 59 | Admitting: Family

## 2023-09-24 ENCOUNTER — Telehealth: Payer: Self-pay | Admitting: Family

## 2023-09-24 VITALS — BP 138/88 | HR 82 | Resp 18 | Ht 70.0 in | Wt 209.6 lb

## 2023-09-24 DIAGNOSIS — M791 Myalgia, unspecified site: Secondary | ICD-10-CM

## 2023-09-24 DIAGNOSIS — R202 Paresthesia of skin: Secondary | ICD-10-CM

## 2023-09-24 DIAGNOSIS — E781 Pure hyperglyceridemia: Secondary | ICD-10-CM

## 2023-09-24 DIAGNOSIS — R200 Anesthesia of skin: Secondary | ICD-10-CM | POA: Diagnosis not present

## 2023-09-24 DIAGNOSIS — R252 Cramp and spasm: Secondary | ICD-10-CM | POA: Diagnosis not present

## 2023-09-24 DIAGNOSIS — I10 Essential (primary) hypertension: Secondary | ICD-10-CM | POA: Diagnosis not present

## 2023-09-24 MED ORDER — FENOFIBRATE 145 MG PO TABS: 145.0000 mg | ORAL_TABLET | Freq: Every day | ORAL | 11 refills | Status: DC

## 2023-09-24 MED ORDER — AMLODIPINE BESYLATE 10 MG PO TABS: 10.0000 mg | ORAL_TABLET | Freq: Every day | ORAL | 11 refills | Status: DC

## 2023-09-24 MED ORDER — METHOCARBAMOL 500 MG PO TABS
500.0000 mg | ORAL_TABLET | Freq: Three times a day (TID) | ORAL | 0 refills | Status: DC | PRN
Start: 2023-09-24 — End: 2024-07-14

## 2023-09-24 NOTE — Telephone Encounter (Signed)
FYI: This call has been transferred to triage nurse: the Triage Nurse. Once the result note has been entered staff can address the message at that time.  Patient called in with the following symptoms:  Red Word: Swelling in legs and Pain level 10/10   Please advise at Mobile [phone] (mobile)  Message is routed to Provider Pool.

## 2023-09-24 NOTE — Telephone Encounter (Signed)
Appt today w/ Tony Robertson

## 2023-09-24 NOTE — Telephone Encounter (Signed)
Pt is scheduled for an OV 09/24/2023.

## 2023-09-24 NOTE — Telephone Encounter (Signed)
Initial Comment Caller states he is having real bad pain in his knees. Translation No Nurse Assessment Nurse: Tony Amos, RN, Tony Robertson Date/Time (Eastern Time): 09/24/2023 11:08:27 AM Confirm and document reason for call. If symptomatic, describe symptoms. ---Above both knees there are two soft knots that are painful. They are getting bigger and painful to lay on. The right leg is worse. Does the patient have any new or worsening symptoms? ---Yes Will a triage be completed? ---Yes Related visit to physician within the last 2 weeks? ---No Does the PT have any chronic conditions? (i.e. diabetes, asthma, this includes High risk factors for pregnancy, etc.) ---Yes List chronic conditions. ---HTN, high cholesterol Is this a behavioral health or substance abuse call? ---No Guidelines Guideline Title Affirmed Question Affirmed Notes Nurse Date/Time Tony Robertson Time) Knee Pain [1] Very swollen joint AND [2] no fever Tony Amos, RN, Tony Robertson 09/24/2023 11:09:59 AM Disp. Time Tony Robertson Time) Disposition Final User 09/24/2023 11:12:39 AM See PCP within 24 Hours Yes Tony Amos, RN, Tony Robertson Final Disposition 09/24/2023 11:12:39 AM See PCP within 24 Hours Yes Vassallo, RN, Tony Robertson PLEASE NOTE: All timestamps contained within this report are represented as [place] Standard Time. CONFIDENTIALTY NOTICE: This fax transmission is intended only for the addressee. It contains information that is legally privileged, confidential or otherwise protected from use or disclosure. If you are not the intended recipient, you are strictly prohibited from reviewing, disclosing, copying using or disseminating any of this information or taking any action in reliance on or regarding this information. If you have received this fax in error, please notify us immediately by telephone so that we can arrange for its return to [place]. Phone: [phone], Toll-Free: [phone], Fax: [phone] Page: 2 of 2 Call Id:  [id number] Caller Disagree/Comply Comply Caller Understands Yes PreDisposition Call Doctor Care Advice Given Per Guideline SEE PCP WITHIN 24 HOURS: * IF OFFICE WILL BE OPEN: You need to be examined within the next 24 hours. Call your doctor (or NP/PA) when the office opens and make an appointment. ELASTIC BANDAGE (ACE WRAP) FOR KNEE SWELLING: * Wrap an elastic bandage (such as an ACE wrap) loosely around your knee. It should be snug but comfortable. USE A COLD PACK FOR AN OVERUSE INJURY: * Put a COLD PACK or an ice bag (wrapped in a moist towel) on the area for 20 minutes. Repeat in 1 hour, then every 4 hours while awake. * For pain relief, you can take either acetaminophen, ibuprofen, or naproxen. * They are over-the-counter (OTC) pain drugs. You can buy them at the drugstore. * ACETAMINOPHEN - REGULAR STRENGTH TYLENOL: Take 650 mg (two 325 mg pills) by mouth every 4 to 6 hours as needed. Each Regular Strength Tylenol pill has 325 mg of acetaminophen. The most you should take is 10 pills a day (3,250 mg total). Note: In [place], the maximum is 12 pills a day (3,900 mg total). * IBUPROFEN (SUCH AS MOTRIN, ADVIL): Take 400 mg (two 200 mg pills) by mouth every 6 hours. The most you should take is 6 pills a day (1,200 mg total). CALL BACK IF: * Fever occurs * You become worse CARE ADVICE given per Knee Pain (Adult) guideline. Referrals REFERRED TO PCP OFFICE

## 2023-09-24 NOTE — Progress Notes (Signed)
Tony Robertson. is a 52 y.o. male with the following history as recorded in EpicCare:  Patient Active Problem List   Diagnosis Date Noted   Bilateral low back pain without sciatica 10/20/2019   Routine general medical examination at a health care facility 11/02/2012   Other abnormal glucose 11/02/2012   Essential hypertension, benign 11/02/2012   Shifting sleep-work schedule, affecting sleep 04/16/2011   Hypertriglyceridemia 03/26/2011   Tobacco abuse 03/26/2011   SLEEP APNEA 03/12/2011    Current Outpatient Medications  Medication Sig Dispense Refill   methocarbamol (ROBAXIN) 500 MG tablet Take 1 tablet (500 mg total) by mouth every 8 (eight) hours as needed for muscle spasms. 30 tablet 0   amLODipine (NORVASC) 10 MG tablet Take 1 tablet (10 mg total) by mouth daily. 30 tablet 11   fenofibrate (TRICOR) 145 MG tablet Take 1 tablet (145 mg total) by mouth daily. 30 tablet 11   No current facility-administered medications for this visit.    Allergies: Penicillins  Past Medical History:  Diagnosis Date   Anemia    High cholesterol    Hypertension    Tobacco abuse     Past Surgical History:  Procedure Laterality Date   NO PAST SURGERIES      Family History  Problem Relation Age of Onset   Rheum arthritis Mother    Hypertension Mother    Colon polyps Father    Hyperlipidemia Father    Heart disease Father    Hypertension Father    Hypertension Sister    Cancer Maternal Aunt    Cancer Maternal Grandmother    Asthma Cousin    Hypertension Other    Colon cancer Neg Hx    Esophageal cancer Neg Hx    Rectal cancer Neg Hx    Stomach cancer Neg Hx     Social History   Tobacco Use   Smoking status: Every Day    Current packs/day: 0.25    Average packs/day: 0.3 packs/day for 20.0 years (5.0 ttl pk-yrs)    Types: Cigarettes   Smokeless tobacco: Never  Substance Use Topics   Alcohol use: Yes    Alcohol/week: 10.0 standard drinks of alcohol    Types: 5 Cans of  beer, 5 Shots of liquor per week    Comment: 2-3 beers a day    Subjective:   Accompanied by wife; concerned about swelling in legs x 1-2 months; symptoms worse at night-feels a "knot" tighten up in thighs at night after work all day; job is physical in nature and requires heavy lifting;  Not currently taking his blood pressure or cholesterol medication for the past month;   Objective:  Vitals:   09/24/23 1549  BP: 138/88  Pulse: 82  Resp: 18  SpO2: 99%  Weight: 209 lb 9.6 oz (95.1 kg)  Height: 5\' 10"  (1.778 m)    General: Well developed, well nourished, in no acute distress  Skin : Warm and dry.  Head: Normocephalic and atraumatic  Eyes: Sclera and conjunctiva clear; pupils round and reactive to light; extraocular movements intact  Ears: External normal; canals clear; tympanic membranes normal  Oropharynx: Pink, supple. No suspicious lesions  Neck: Supple without thyromegaly, adenopathy  Lungs: Respirations unlabored;  Musculoskeletal: No deformities; no active joint inflammation  Extremities: No edema, cyanosis, clubbing  Vessels: Symmetric bilaterally  Neurologic: Alert and oriented; speech intact; face symmetrical; moves all extremities well; CNII-XII intact without focal deficit   Assessment:  1. Numbness and tingling of lower extremity  2. Myalgia   3. Muscle cramps   4. Essential hypertension   5. Hypertriglyceridemia     Plan:   Suspect muscle cramps due to dehydration; physical exam is reassuring; encouraged to stay hydrated/ consider adding electrolyte; Update labs today including CBC, CMP, B12, CK, magnesium; trail of Robaxin 500 mg at bedtime;  Refills updated on Amlodipine and Tricor;  Work note given as requested;   No follow-ups on file.  Orders Placed This Encounter  Procedures   CBC with Differential/Platelet   Comp Met (CMET)   B12   Magnesium   CK (Creatine Kinase)    Requested Prescriptions   Signed Prescriptions Disp Refills   amLODipine  (NORVASC) 10 MG tablet 30 tablet 11    Sig: Take 1 tablet (10 mg total) by mouth daily.   fenofibrate (TRICOR) 145 MG tablet 30 tablet 11    Sig: Take 1 tablet (145 mg total) by mouth daily.   methocarbamol (ROBAXIN) 500 MG tablet 30 tablet 0    Sig: Take 1 tablet (500 mg total) by mouth every 8 (eight) hours as needed for muscle spasms.

## 2023-09-25 LAB — COMPREHENSIVE METABOLIC PANEL
ALT: 22 U/L (ref 0–53)
AST: 45 U/L — ABNORMAL HIGH (ref 0–37)
Albumin: 4.4 g/dL (ref 3.5–5.2)
Alkaline Phosphatase: 71 U/L (ref 39–117)
BUN: 18 mg/dL (ref 6–23)
CO2: 25 meq/L (ref 19–32)
Calcium: 10 mg/dL (ref 8.4–10.5)
Chloride: 103 meq/L (ref 96–112)
Creatinine, Ser: 0.95 mg/dL (ref 0.40–1.50)
GFR: 91.51 mL/min (ref >60.00)
Glucose, Bld: 75 mg/dL (ref 70–99)
Potassium: 4.3 meq/L (ref 3.5–5.1)
Sodium: 138 meq/L (ref 135–145)
Total Bilirubin: 0.5 mg/dL (ref 0.2–1.2)
Total Protein: 7.9 g/dL (ref 6.0–8.3)

## 2023-09-25 LAB — CBC WITH DIFFERENTIAL/PLATELET
Basophils Absolute: 0.1 10*3/uL (ref 0.0–0.1)
Basophils Relative: 0.8 % (ref 0.0–3.0)
Eosinophils Absolute: 0.3 10*3/uL (ref 0.0–0.7)
Eosinophils Relative: 2.8 % (ref 0.0–5.0)
HCT: 35.6 % — ABNORMAL LOW (ref 39.0–52.0)
Hemoglobin: 11.5 g/dL — ABNORMAL LOW (ref 13.0–17.0)
Lymphocytes Relative: 35.4 % (ref 12.0–46.0)
Lymphs Abs: 3.7 10*3/uL (ref 0.7–4.0)
MCHC: 32.2 g/dL (ref 30.0–36.0)
MCV: 92.8 fL (ref 78.0–100.0)
Monocytes Absolute: 1 10*3/uL (ref 0.1–1.0)
Monocytes Relative: 9 % (ref 3.0–12.0)
Neutro Abs: 5.5 10*3/uL (ref 1.4–7.7)
Neutrophils Relative %: 52 % (ref 43.0–77.0)
Platelets: 303 10*3/uL (ref 150.0–400.0)
RBC: 3.83 Mil/uL — ABNORMAL LOW (ref 4.22–5.81)
RDW: 15.5 % (ref 11.5–15.5)
WBC: 10.5 10*3/uL (ref 4.0–10.5)

## 2023-09-25 LAB — MAGNESIUM: Magnesium: 1.6 mg/dL (ref 1.5–2.5)

## 2023-09-25 LAB — CK: Total CK: 313 U/L — ABNORMAL HIGH (ref 7–232)

## 2023-09-25 LAB — VITAMIN B12: Vitamin B-12: 273 pg/mL (ref 211–911)

## 2023-09-30 ENCOUNTER — Telehealth: Payer: Self-pay | Admitting: Family

## 2023-09-30 ENCOUNTER — Other Ambulatory Visit: Payer: Self-pay | Admitting: Family

## 2023-09-30 DIAGNOSIS — M79604 Pain in right leg: Secondary | ICD-10-CM

## 2023-09-30 NOTE — Telephone Encounter (Signed)
Pt's friend, Misty Stanley, called and stated that the pt is currently still in pain and his symptoms of swelling has not improved since seeing Vernona Rieger on 9/26. The pt doesn't believe the medication he was prescribed is working. I offered to schedule another OV for the pt but Misty Stanley wanted to know if Vernona Rieger thinks [place] should be referred somewhere to have his leg examined. Please call and advise [place].

## 2023-10-01 NOTE — Progress Notes (Signed)
I, Stevenson Clinch, CMA acting as a scribe for Clementeen Graham, MD.  Tony Robertson. is a 52 y.o. male who presents to [facility] at [facility] today for bilat LE pain ongoing since July-Aug. Pt locates pain to legs. Swelling present proximal to the knees. Short-term relief with muscle relaxer's and Tylenol. Denies increased warmth or erythema in the legs. Works at [employer], does a lot of lifting there. 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   He has to do a lot of lifting on his own sometimes sleeper sofa is weighing over 300 pounds.  Low back pain: chronic lower back pain Swelling: B LE Aggravates: exacerbated by working, nighttime Treatments tried: Tylenol   Dx testing: 11/04/22 L-spine XR 06/09/17 L LE vasc US  Pertinent review of systems: No fevers or chills  Relevant historical information: History of chronic back pain.   Exam:  BP 130/88   Pulse 94   Ht 5\' 10"  (1.778 m)   Wt 206 lb (93.4 kg)   SpO2 96%   BMI 29.56 kg/m  General: Well Developed, well nourished, and in no acute distress.   MSK: L-spine nontender palpation midline.  Normal lumbar motion.  Lower extremity strength is intact. Positive slump test right side.  Knees bilaterally normal appearing. Normal motion. Tender palpation distal quad tendons.  Intact strength.     Lab and Radiology Results  X-ray images bilateral knees obtained today personally and independently interpreted  Right knee: Mild medial DJD.  Atherosclerosis present posterior proximal thigh.  No acute fractures are visible.  Left knee: Mild lateral compartment DJD.  Atherosclerosis is also present at the posterior proximal thigh.  No acute fractures are visible.  Await formal radiology review  EXAM: LUMBAR SPINE - COMPLETE 4+ VIEW   COMPARISON:  12/16/2019 lumbar spine radiographs.   FINDINGS: This report assumes 5 non rib-bearing lumbar vertebrae.   Lumbar vertebral body heights are  preserved, with no fracture.   Mild multilevel lumbar degenerative disc disease, most prominent at L3-4, not substantially changed. Mild 3 mm retrolisthesis at L2-3 and 3 mm anterolisthesis at L3-4, not substantially changed. No significant facet arthropathy. No aggressive appearing focal osseous lesions.   IMPRESSION: No acute osseous abnormality.   Mild multilevel lumbar degenerative disc disease, most prominent at L3-4, not substantially changed.   Mild multilevel lumbar spondylolisthesis, not substantially changed.     Electronically Signed   By: Delbert Phenix M.D.   On: 11/04/2022 08:49 I, Clementeen Graham, personally (independently) visualized and performed the interpretation of the images attached in this note.   Assessment and Plan: 52 y.o. male with chronic low back pain with pain radiating down the right leg in an L4 dermatomal pattern.  This is also associated with bilateral knee pain.  I believe this pain is multifactorial.  I do think it is likely he has some component of lumbar radiculopathy likely right L4 and some knee arthritis and possibly quad tendinitis.  He is a good candidate for trial of physical therapy.  Plan to refer to PT and also will try gabapentin at bedtime and a short course of prednisone.  Recheck in about 6 weeks.  If not improved consider MRI lumbar spine or other advanced imaging.  Additionally I have written a work note.  He is lifting by himself sometimes sleeper sofa is that way over 350 pounds.  I think a 100 pound lifting restriction at work is pretty reasonable.   PDMP not  reviewed this encounter. Orders Placed This Encounter  Procedures   DG Knee AP/LAT W/Sunrise Left    Standing Status:   Future    Number of Occurrences:   1    Standing Expiration Date:   11/02/2023    Order Specific Question:   Reason for Exam (SYMPTOM  OR DIAGNOSIS REQUIRED)    Answer:   bilateral knee pain    Order Specific Question:   Preferred imaging location?     Answer:   [facility]   DG Knee AP/LAT W/Sunrise Right    Standing Status:   Future    Number of Occurrences:   1    Standing Expiration Date:   11/02/2023    Order Specific Question:   Reason for Exam (SYMPTOM  OR DIAGNOSIS REQUIRED)    Answer:   bilateral knee pain    Order Specific Question:   Preferred imaging location?    Answer:   [facility]   Ambulatory referral to Physical Therapy    Referral Priority:   Routine    Referral Type:   Physical Medicine    Referral Reason:   Specialty Services Required    Requested Specialty:   Physical Therapy    Number of Visits Requested:   1   Meds ordered this encounter  Medications   predniSONE (DELTASONE) 50 MG tablet    Sig: Take 1 tablet (50 mg total) by mouth daily.    Dispense:  5 tablet    Refill:  0   gabapentin (NEURONTIN) 300 MG capsule    Sig: Take 1 capsule (300 mg total) by mouth 3 (three) times daily as needed.    Dispense:  90 capsule    Refill:  3     Discussed warning signs or symptoms. Please see discharge instructions. Patient expresses understanding.   The above documentation has been reviewed and is accurate and complete Clementeen Graham, M.D.

## 2023-10-01 NOTE — Telephone Encounter (Signed)
Called pt and left a VM informing pt of referral, advised pt he should be getting a call from sports medicine to get appointment scheduled. Advised pt to call the office back with any further questions or concerns.

## 2023-10-02 ENCOUNTER — Encounter: Payer: Self-pay | Admitting: Family Medicine

## 2023-10-02 ENCOUNTER — Ambulatory Visit (INDEPENDENT_AMBULATORY_CARE_PROVIDER_SITE_OTHER): Payer: 59

## 2023-10-02 ENCOUNTER — Ambulatory Visit (INDEPENDENT_AMBULATORY_CARE_PROVIDER_SITE_OTHER): Payer: 59 | Admitting: Family Medicine

## 2023-10-02 VITALS — BP 130/88 | HR 94 | Ht 70.0 in | Wt 206.0 lb

## 2023-10-02 DIAGNOSIS — G8929 Other chronic pain: Secondary | ICD-10-CM | POA: Diagnosis not present

## 2023-10-02 DIAGNOSIS — M5441 Lumbago with sciatica, right side: Secondary | ICD-10-CM

## 2023-10-02 DIAGNOSIS — M79604 Pain in right leg: Secondary | ICD-10-CM | POA: Diagnosis not present

## 2023-10-02 DIAGNOSIS — M79605 Pain in left leg: Secondary | ICD-10-CM | POA: Diagnosis not present

## 2023-10-02 MED ORDER — PREDNISONE 50 MG PO TABS: 50.0000 mg | ORAL_TABLET | Freq: Every day | ORAL | 0 refills | Status: DC

## 2023-10-02 MED ORDER — GABAPENTIN 300 MG PO CAPS: 300.0000 mg | ORAL_CAPSULE | Freq: Three times a day (TID) | ORAL | 3 refills | Status: DC | PRN

## 2023-10-02 NOTE — Patient Instructions (Addendum)
Thank you for coming in today.   Please get an Xray today before you leave   I've referred you to Physical Therapy.  Let us know if you don't hear from them in one week.   Take prednisone for 5 days.   Try gabapentin nerve pain medicine at night.   Check back in 6 weeks

## 2023-10-22 NOTE — Therapy (Signed)
OUTPATIENT PHYSICAL THERAPY LOWER EXTREMITY EVALUATION   Patient Name: Tony Robertson. MRN: [id number] DOB:1971-10-27, 52 y.o., male Today's Date: 10/23/2023  END OF SESSION:  PT End of Session - 10/23/23 1109     Visit Number 1    Date for PT Re-Evaluation 01/01/24    PT Start Time 0800    PT Stop Time 0840    PT Time Calculation (min) 40 min    Activity Tolerance Patient limited by pain    Behavior During Therapy [facility] for tasks assessed/performed             Past Medical History:  Diagnosis Date   Anemia    High cholesterol    Hypertension    Tobacco abuse    Past Surgical History:  Procedure Laterality Date   NO PAST SURGERIES     Patient Active Problem List   Diagnosis Date Noted   Chronic low back pain with sciatica 10/20/2019   Routine general medical examination at a health care facility 11/02/2012   Other abnormal glucose 11/02/2012   Essential hypertension, benign 11/02/2012   Shifting sleep-work schedule, affecting sleep 04/16/2011   Hypertriglyceridemia 03/26/2011   Tobacco abuse 03/26/2011   Sleep apnea 03/12/2011    PCP: Olive Bass, FNP   REFERRING PROVIDER: Rodolph Bong, MD   REFERRING DIAG: M79.604,M79.605 (ICD-10-CM) - Bilateral leg pain   THERAPY DIAG:  Bilateral leg pain  Difficulty in walking, not elsewhere classified  Muscle weakness (generalized)  Other low back pain  Rationale for Evaluation and Treatment: Rehabilitation  ONSET DATE: 10/02/23  SUBJECTIVE:   SUBJECTIVE STATEMENT: Patient reports back pain and B knee pain. He uses cream at night on knees which helps initially, but he wakes up at about 1:30 due to pain. Back pain x 6 years, knee pain started about 2 years ago and recently started swelling and having increased pain.  PERTINENT HISTORY: Per referring physician note: Assessment and Plan: 52 y.o. male with chronic low back pain with pain radiating down the right leg in an L4 dermatomal pattern.   This is also associated with bilateral knee pain.  I believe this pain is multifactorial.  I do think it is likely he has some component of lumbar radiculopathy likely right L4 and some knee arthritis and possibly quad tendinitis.  He is a good candidate for trial of physical therapy.  Plan to refer to PT and also will try gabapentin at bedtime and a short course of prednisone.  Recheck in about 6 weeks.  If not improved consider MRI lumbar spine or other advanced imaging.  PAIN:  Are you having pain? 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 No  LIVING ENVIRONMENT: Lives with: lives with their partner Lives in: House/apartment Stairs: Yes: Internal: 12 steps; on left going up Has following equipment at home: None  OCCUPATION: Works at [employer], heavy lifting, up to 350#  PLOF: Independent  PATIENT GOALS: Patient would like to manage his pain better.  NEXT MD VISIT: about 3 weeks  OBJECTIVE:  Note: Objective measures were completed at Evaluation unless otherwise noted.  DIAGNOSTIC FINDINGS:  10/02/23 X-ray images bilateral knees obtained today personally and independently interpreted  Right knee:  Mild medial DJD.  Atherosclerosis present posterior proximal thigh.  No acute fractures are visible. Left knee: Mild lateral compartment DJD.  Atherosclerosis is also present at the posterior proximal thigh.  No acute fractures are visible. Lumbar IMPRESSION: No acute osseous abnormality.  Mild multilevel lumbar degenerative disc disease, most prominent at L3-4, not substantially changed.  Mild multilevel lumbar spondylolisthesis, not substantially changed.  COGNITION: Overall  cognitive status: Within functional limits for tasks assessed     SENSATION: T & N on the outside of L foot  EDEMA:  Swelling in B distal quads, also B lower back above iliac crests. He reports that the back has been that way for a long time, the quad swelling is new.  MUSCLE LENGTH: Hamstrings: Right 30 deg; Left 30 deg Thomas test: At least to neutral  POSTURE:  R leg in slight ER  PALPATION: TTP on B paraspinals L1-S1, gluts, piriformis, L > R in all areas, B distal quad TTP along with patellar tendons, B patella crepitus and pain with compression.  LUMBAR ROM: WNL in all planes, pulled when in flexion and B rotation.  LOWER EXTREMITY ROM: B hip ROM severely limited due to causing back pain, B knee flexion mildly limited due to pain  LOWER EXTREMITY MMT: B hip strength limited in all planes due to pain in back, at least 3+/5, but further resistance deferred, B knees at least 4+/5, but painful. B distal quads exhibit defect in the muscle, L > R. Area of atrophy upon active quad set.  LOWER EXTREMITY SPECIAL TESTS:  Knee special tests: Apley's compression test: positive   LUMBAR SPECIAL TESTS:  Slump test (+) on L and SLR tests (+) B  FUNCTIONAL TESTS:  Owestry 27/50=54% disability= severe disability  GAIT: Distance walked: In clinic distances Assistive device utilized: None Level of assistance: Complete Independence Comments: No major gait deviations noted today, but patient reports that after going in to work and lifting as required his back and knee pain causes abnormal gait   TODAY'S TREATMENT:                                                                                                                              DATE:  10/23/23 Education    PATIENT EDUCATION:  Education details: POC Person educated: Patient Education method: Explanation Education comprehension: verbalized understanding  HOME EXERCISE PROGRAM: TBD  ASSESSMENT:  CLINICAL  IMPRESSION: Patient is a 52 y.o. who was seen today for physical therapy evaluation and treatment for H/O LBP with new onset of B knee pain, swelling in knees. 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 X ray reveals some lumbar spine degenerative changes with possible nerve impingement at L4 as well as B knee degenerative changes and possible quad tendonitis.  He presents with swelling in B distal quads as well as small area of inactive/atrophied muscle  in distal quads, L > R. He has crepitus under B patellas with (+) compression test. He also has B low back swelling and pain from L1-S1. His hip ROM are both moderately restricted due to pain in his back with testing. He did find prone on elbows relieving. His symptoms appear to be both chronic and acute in nature. The acute knee pain causes him to awaken at night. He also reports intermittent N & T in lateral L foot indicating possible L4 nerve root involvement. He scored 54% on the Owestry scale indicating severe disability. He did get an order from his Dr to restrict lifting to < 100#, but reports that when he presented it at work, he was told there were no other options. His back pain increases during the day at work as he lifts more. He will benefit from PT to address his acute pain as well as to facilitate stretching and strengthening for trunk and LE to try better manage his chronic pain. Recommended he discuss possible STD with his physician to see if he would qualify, in order to allow some rest for his knees and back while he participates in rehab.  OBJECTIVE IMPAIRMENTS: Abnormal gait, decreased activity tolerance, difficulty walking, decreased ROM, decreased strength, postural dysfunction, and pain.   ACTIVITY LIMITATIONS: carrying, lifting, bending, squatting, stairs, and locomotion level  PARTICIPATION LIMITATIONS: shopping, community activity, occupation, and yard  work  PERSONAL FACTORS: Past/current experiences are also affecting patient's functional outcome.   REHAB POTENTIAL: Good  CLINICAL DECISION MAKING: Stable/uncomplicated  EVALUATION COMPLEXITY: Moderate   GOALS: Goals reviewed with patient? Yes  SHORT TERM GOALS: Target date: 11/08/23 I with initial HEP Baseline: Goal status: INITIAL  LONG TERM GOALS: Target date: 01/01/24  I with final HEP Baseline:  Goal status: INITIAL  2.  Patient will report 75% improvement in pain in knees while performing all normal daily activities including work and leisure, and be able to sleep through the night without knee pain awakening him Baseline: 10/10 knee pain at night Goal status: INITIAL  3.  Restore B hip ROM to normal with no pain in back Baseline: Moderately limited in all planes due to causing back pain Goal status: INITIAL  4.  Decrease Owestry disability rating to < 20% to indicate minimal disability Baseline: 54%, severe disability Goal status: INITIAL  5.  Patient will report resolution of N & T in L lateral foot as well as 75% improvement in his back pain overall. Baseline: "20/10" Goal status: INITIAL  6.  Improve SLR ROM to at least 60 degrees B without increased LBP Baseline: 30 degrees Goal status: INITIAL   PLAN:  PT FREQUENCY: 2x/week  PT DURATION: 10 weeks  PLANNED INTERVENTIONS: 97110-Therapeutic exercises, 97530- Therapeutic activity, [id number]- Neuromuscular re-education, 97535- Self Care, 13086- Manual therapy, [id number]- Gait training, 97014- Electrical stimulation (unattended), [id number]- Traction (mechanical), [id number]- Ionotophoresis 4mg /ml Dexamethasone, Patient/Family education, Taping, Dry Needling, Joint mobilization, Spinal mobilization, Cryotherapy, and Moist heat  PLAN FOR NEXT SESSION: Assess possible TNS, traction, and additional acute pain treatment ideas. Initiate HEP for stretch and isometric strengthening for low back, hip strengthening.   Iona Beard, DPT 10/23/2023, 11:43 AM

## 2023-10-23 ENCOUNTER — Ambulatory Visit: Payer: 59 | Attending: Family Medicine | Admitting: Physical Therapy

## 2023-10-23 ENCOUNTER — Encounter: Payer: Self-pay | Admitting: Physical Therapy

## 2023-10-23 DIAGNOSIS — M79605 Pain in left leg: Secondary | ICD-10-CM | POA: Insufficient documentation

## 2023-10-23 DIAGNOSIS — M5459 Other low back pain: Secondary | ICD-10-CM | POA: Insufficient documentation

## 2023-10-23 DIAGNOSIS — M79604 Pain in right leg: Secondary | ICD-10-CM | POA: Insufficient documentation

## 2023-10-23 DIAGNOSIS — R262 Difficulty in walking, not elsewhere classified: Secondary | ICD-10-CM | POA: Insufficient documentation

## 2023-10-23 DIAGNOSIS — M6281 Muscle weakness (generalized): Secondary | ICD-10-CM | POA: Diagnosis present

## 2023-10-26 ENCOUNTER — Ambulatory Visit: Payer: 59

## 2023-10-26 NOTE — Progress Notes (Signed)
Right knee x-ray shows some arthritis.

## 2023-10-26 NOTE — Progress Notes (Signed)
Left knee x-ray shows some arthritis.

## 2023-11-04 ENCOUNTER — Ambulatory Visit: Payer: 59 | Attending: Family Medicine | Admitting: Physical Therapy

## 2023-11-06 ENCOUNTER — Ambulatory Visit: Payer: 59 | Admitting: Physical Therapy

## 2023-11-11 ENCOUNTER — Telehealth: Payer: Self-pay | Admitting: Physical Therapy

## 2023-11-11 ENCOUNTER — Ambulatory Visit: Payer: 59 | Admitting: Physical Therapy

## 2023-11-11 NOTE — Telephone Encounter (Signed)
No-show for today's PT appt. Attempted to leave message on voicemail but line disconnected. Unable to reach pt.    Nedra Hai, PT, DPT 11/11/23 8:18 AM

## 2023-11-13 ENCOUNTER — Ambulatory Visit: Payer: 59 | Admitting: Family Medicine

## 2023-11-13 ENCOUNTER — Ambulatory Visit: Payer: 59 | Admitting: Physical Therapy

## 2023-11-17 ENCOUNTER — Ambulatory Visit: Payer: 59 | Admitting: Physical Therapy

## 2023-11-20 ENCOUNTER — Ambulatory Visit: Payer: 59 | Admitting: Physical Therapy

## 2024-01-04 ENCOUNTER — Other Ambulatory Visit: Payer: Self-pay | Admitting: Family

## 2024-01-04 NOTE — Telephone Encounter (Signed)
 Copied from CRM [id number]. Topic: Clinical - Medication Refill >> Jan 04, 2024 12:52 PM Isabell A wrote: Most Recent Primary Care Visit:  Provider: JASON DOFFING [facility]  Department: [facility]  Visit Type: OFFICE VISIT  Date: 09/24/2023  Medication: gabapentin  (NEURONTIN ) 300 MG capsule - Patient requesting only 1 month supply.  Has the patient contacted their pharmacy? Yes (Agent: If no, request that the patient contact the pharmacy for the refill. If patient does not wish to contact the pharmacy document the reason why and proceed with request.) (Agent: If yes, when and what did the pharmacy advise?) Call provider for refill.   Is this the correct pharmacy for this prescription? Yes If no, delete pharmacy and type the correct one.  This is the patient's preferred pharmacy:  [phone] [address] [place] - [address] [place] [address] Phone: [phone] Fax: [phone]   Has the prescription been filled recently? No  Is the patient out of the medication? Yes  Has the patient been seen for an appointment in the last year OR does the patient have an upcoming appointment? Yes  Can we respond through MyChart? No  Agent: Please be advised that Rx refills may take up to 3 business days. We ask that you follow-up with your pharmacy.

## 2024-01-04 NOTE — Telephone Encounter (Signed)
 Copied from CRM [id number]. Topic: Clinical - Medication Refill >> Jan 04, 2024 12:52 PM Isabell A wrote: Most Recent Primary Care Visit:  Provider: JASON DOFFING [facility]  Department: [facility]  Visit Type: OFFICE VISIT  Date: 09/24/2023  Medication: gabapentin  (NEURONTIN ) 300 MG capsule  Has the patient contacted their pharmacy?  (Agent: If no, request that the patient contact the pharmacy for the refill. If patient does not wish to contact the pharmacy document the reason why and proceed with request.) (Agent: If yes, when and what did the pharmacy advise?)  Is this the correct pharmacy for this prescription?  If no, delete pharmacy and type the correct one.  This is the patient's preferred pharmacy:  [phone] [address] [place] - [address] [place] [address] Phone: [phone] Fax: [phone]   Has the prescription been filled recently?   Is the patient out of the medication?   Has the patient been seen for an appointment in the last year OR does the patient have an upcoming appointment?   Can we respond through MyChart?   Agent: Please be advised that Rx refills may take up to 3 business days. We ask that you follow-up with your pharmacy.

## 2024-01-05 MED ORDER — GABAPENTIN 300 MG PO CAPS: 300.0000 mg | ORAL_CAPSULE | Freq: Three times a day (TID) | ORAL | 3 refills | Status: DC | PRN

## 2024-07-14 ENCOUNTER — Other Ambulatory Visit: Payer: Self-pay | Admitting: Family

## 2024-07-14 ENCOUNTER — Encounter: Payer: Self-pay | Admitting: Family

## 2024-07-14 ENCOUNTER — Ambulatory Visit: Admitting: Family

## 2024-07-14 VITALS — BP 138/82 | HR 88 | Ht 70.0 in | Wt 201.0 lb

## 2024-07-14 DIAGNOSIS — E785 Hyperlipidemia, unspecified: Secondary | ICD-10-CM

## 2024-07-14 LAB — COMPREHENSIVE METABOLIC PANEL WITH GFR
ALT: 24 U/L (ref 0–53)
AST: 38 U/L — ABNORMAL HIGH (ref 0–37)
Albumin: 4.5 g/dL (ref 3.5–5.2)
Alkaline Phosphatase: 73 U/L (ref 39–117)
BUN: 14 mg/dL (ref 6–23)
CO2: 30 meq/L (ref 19–32)
Calcium: 10 mg/dL (ref 8.4–10.5)
Chloride: 100 meq/L (ref 96–112)
Creatinine, Ser: 0.84 mg/dL (ref 0.40–1.50)
GFR: 99.14 mL/min (ref >60.00)
Glucose, Bld: 97 mg/dL (ref 70–99)
Potassium: 4.4 meq/L (ref 3.5–5.1)
Sodium: 140 meq/L (ref 135–145)
Total Bilirubin: 0.4 mg/dL (ref 0.2–1.2)
Total Protein: 7.7 g/dL (ref 6.0–8.3)

## 2024-07-14 LAB — LIPID PANEL
Cholesterol: 226 mg/dL — ABNORMAL HIGH (ref 0–200)
HDL: 93.5 mg/dL (ref >39.00)
LDL Cholesterol: 61 mg/dL (ref 0–99)
NonHDL: 132.03
Total CHOL/HDL Ratio: 2
Triglycerides: 355 mg/dL — ABNORMAL HIGH (ref 0.0–149.0)
VLDL: 71 mg/dL — ABNORMAL HIGH (ref 0.0–40.0)

## 2024-07-14 NOTE — Progress Notes (Signed)
  Tony Robertson. is a 53 y.o. male with the following history as recorded in EpicCare:  Patient Active Problem List   Diagnosis Date Noted   Chronic low back pain with sciatica 10/20/2019   Routine general medical examination at a health care facility 11/02/2012   Other abnormal glucose 11/02/2012   Essential hypertension, benign 11/02/2012   Shifting sleep-work schedule, affecting sleep 04/16/2011   Hypertriglyceridemia 03/26/2011   Tobacco abuse 03/26/2011   Sleep apnea 03/12/2011    Current Outpatient Medications  Medication Sig Dispense Refill   amLODipine  (NORVASC ) 10 MG tablet Take 1 tablet (10 mg total) by mouth daily. 30 tablet 11   No current facility-administered medications for this visit.    Allergies: Penicillins  Past Medical History:  Diagnosis Date   Anemia    High cholesterol    Hypertension    Tobacco abuse     Past Surgical History:  Procedure Laterality Date   NO PAST SURGERIES      Family History  Problem Relation Age of Onset   Rheum arthritis Mother    Hypertension Mother    Colon polyps Father    Hyperlipidemia Father    Heart disease Father    Hypertension Father    Hypertension Sister    Cancer Maternal Aunt    Cancer Maternal Grandmother    Asthma Cousin    Hypertension Other    Colon cancer Neg Hx    Esophageal cancer Neg Hx    Rectal cancer Neg Hx    Stomach cancer Neg Hx     Social History   Tobacco Use   Smoking status: Every Day    Current packs/day: 0.25    Average packs/day: 0.3 packs/day for 20.0 years (5.0 ttl pk-yrs)    Types: Cigarettes   Smokeless tobacco: Never  Substance Use Topics   Alcohol use: Yes    Alcohol/week: 10.0 standard drinks of alcohol    Types: 5 Cans of beer, 5 Shots of liquor per week    Comment: 2-3 beers a day    Subjective:   Patient is requesting to have his cholesterol checked- notes he has not been taking his cholesterol medication for the past 4-5 months; he notes his pharmacy would  not fill his medication; Denies any chest pain, shortness of breath, blurred vision or headache      Objective:  Vitals:   07/14/24 0811  BP: 138/82  Pulse: 88  SpO2: 98%  Weight: 201 lb (91.2 kg)  Height: 5' 10 (1.778 m)    General: Well developed, well nourished, in no acute distress  Skin : Warm and dry.  Head: Normocephalic and atraumatic  Lungs: Respirations unlabored; clear to auscultation bilaterally without wheeze, rales, rhonchi  [facility] exam: normal rate and regular rhythm.  Neurologic: Alert and oriented; speech intact; face symmetrical; moves all extremities well; CNII-XII intact without focal deficit   Assessment:  1. Hyperlipidemia, unspecified hyperlipidemia type     Plan:  Update labs today; to determine need for medication; patient is aware that will most likely start him on a statin.   No follow-ups on file.  Orders Placed This Encounter  Procedures   Lipid panel   Comp Met (CMET)    Requested Prescriptions    No prescriptions requested or ordered in this encounter

## 2024-07-15 ENCOUNTER — Ambulatory Visit: Payer: Self-pay | Admitting: Family

## 2024-07-15 ENCOUNTER — Other Ambulatory Visit: Payer: Self-pay | Admitting: Family

## 2024-07-15 MED ORDER — ROSUVASTATIN CALCIUM 10 MG PO TABS
10.0000 mg | ORAL_TABLET | Freq: Every day | ORAL | 1 refills | Status: AC
Start: 2024-07-15 — End: ?

## 2024-08-30 ENCOUNTER — Encounter: Payer: Self-pay | Admitting: Family

## 2024-08-30 ENCOUNTER — Ambulatory Visit: Payer: Self-pay | Admitting: Family

## 2024-08-30 ENCOUNTER — Other Ambulatory Visit: Payer: Self-pay | Admitting: Family

## 2024-08-30 ENCOUNTER — Other Ambulatory Visit

## 2024-08-30 ENCOUNTER — Other Ambulatory Visit (INDEPENDENT_AMBULATORY_CARE_PROVIDER_SITE_OTHER)

## 2024-08-30 ENCOUNTER — Ambulatory Visit: Admitting: Family

## 2024-08-30 DIAGNOSIS — E785 Hyperlipidemia, unspecified: Secondary | ICD-10-CM | POA: Diagnosis not present

## 2024-08-30 LAB — COMPREHENSIVE METABOLIC PANEL WITH GFR
ALT: 22 U/L (ref 0–53)
AST: 44 U/L — ABNORMAL HIGH (ref 0–37)
Albumin: 4.6 g/dL (ref 3.5–5.2)
Alkaline Phosphatase: 72 U/L (ref 39–117)
BUN: 12 mg/dL (ref 6–23)
CO2: 28 meq/L (ref 19–32)
Calcium: 9.6 mg/dL (ref 8.4–10.5)
Chloride: 100 meq/L (ref 96–112)
Creatinine, Ser: 0.85 mg/dL (ref 0.40–1.50)
GFR: 98.69 mL/min (ref >60.00)
Glucose, Bld: 85 mg/dL (ref 70–99)
Potassium: 4.4 meq/L (ref 3.5–5.1)
Sodium: 139 meq/L (ref 135–145)
Total Bilirubin: 0.6 mg/dL (ref 0.2–1.2)
Total Protein: 8 g/dL (ref 6.0–8.3)

## 2024-08-30 LAB — LIPID PANEL
Cholesterol: 194 mg/dL (ref 0–200)
HDL: 93.3 mg/dL (ref >39.00)
LDL Cholesterol: 44 mg/dL (ref 0–99)
NonHDL: 101.06
Total CHOL/HDL Ratio: 2
Triglycerides: 286 mg/dL — ABNORMAL HIGH (ref 0.0–149.0)
VLDL: 57.2 mg/dL — ABNORMAL HIGH (ref 0.0–40.0)

## 2024-09-16 ENCOUNTER — Encounter: Admitting: Student

## 2024-10-13 ENCOUNTER — Other Ambulatory Visit: Payer: Self-pay

## 2024-10-13 MED ORDER — AMLODIPINE BESYLATE 10 MG PO TABS: 10.0000 mg | ORAL_TABLET | Freq: Every day | ORAL | 11 refills | Status: AC

## 2024-10-13 NOTE — Telephone Encounter (Signed)
 Copied from CRM [id number]. Topic: Clinical - Medication Question >> Oct 13, 2024  3:15 PM Thersia C wrote: Reason for RMF:Ejupzwu called in regarding prescription amLODipine  (NORVASC ) 10 MG tablet , pharmacy stated they didn't have medication, patient stated the pharmacy gave him a new medication that he hasn't took before, so would like for someone to give him a callback once they followup with the pharmacy

## 2024-10-21 ENCOUNTER — Encounter: Admitting: Family Medicine

## 2024-11-16 ENCOUNTER — Other Ambulatory Visit: Payer: Self-pay

## 2024-11-16 ENCOUNTER — Telehealth: Payer: Self-pay

## 2024-11-16 NOTE — Telephone Encounter (Signed)
 Copied from CRM [id number]. Topic: Clinical - Prescription Issue >> Nov 16, 2024 11:11 AM Rea ORN wrote: Reason for CRM: [facility]  10 mg until he makes a appt with doctor. Pt stated he has not had is medication in 2 months. Pt has TOC appt 11/30/24  I advised that [employer], NP, ordered this rx on 10/13/24, 30 qty, with 11 refills. I also advised that [facility] [address] on [facility]/13/24 at 3:50 pm  Pt stated when he went to pick up medication he was only given Rosuvastatin  and told to call PCP office for amlodipine .   Please call pt  [phone]

## 2024-11-16 NOTE — Telephone Encounter (Signed)
 Called the [facility] to [facility] [address], [place].  Proceeded to [facility] to find out why Rx was not filled upon request. RX: Amlodipine  10 MG, 11 refills. Patients next appointment is 11/30/24 TOC w. Beverley Hummer, MD  [facility] stated that [facility] but will check patients information and will follow-up with patient once system resumes.

## 2024-11-30 ENCOUNTER — Encounter: Admitting: Family Medicine

## 2024-11-30 NOTE — Progress Notes (Incomplete)
 Assessment  Assessment/Plan:  Assessment and Plan              There are no discontinued medications.  Patient Counseling(The following topics were reviewed and/or handout was given):  -Nutrition: Stressed importance of moderation in sodium/caffeine intake, saturated fat and cholesterol, caloric balance, sufficient intake of fresh fruits, vegetables, and fiber.  -Stressed the importance of regular exercise.   -Substance Abuse: Discussed cessation/primary prevention of tobacco, alcohol, or other drug use; driving or other dangerous activities under the influence; availability of treatment for abuse.   -Injury prevention: Discussed safety belts, safety helmets, smoke detector, smoking near bedding or upholstery.   -Sexuality: Discussed sexually transmitted diseases, partner selection, use of condoms, avoidance of unintended pregnancy and contraceptive alternatives.   -Dental health: Discussed importance of regular tooth brushing, flossing, and dental visits.  -Health maintenance and immunizations reviewed. Please refer to Health maintenance section.  No follow-ups on file.        Subjective:   Encounter date: 11/30/2024  No chief complaint on file.   Discussed the use of AI scribe software for clinical note transcription with the patient, who gave verbal consent to proceed.  History of Present Illness           Hypertension - Managed on Amlodipine  10 mg daily. - Well-controlled with blood pressures in office stable around 130/80 mmHg.   Hyperlipidemia - Managed on Rosuvastatin  10 mg      09/24/2023    3:49 PM 10/28/2022    8:17 AM 01/14/2022   10:39 AM 01/07/2019    9:51 AM  Depression screen PHQ 2/9  Decreased Interest 0 0 0 2  Down, Depressed, Hopeless 0 0 0 0  PHQ - 2 Score 0 0 0 2  Altered sleeping    3  Tired, decreased energy    3  Change in appetite    3  Feeling bad or failure about yourself     2  Trouble concentrating    3  Moving slowly or  fidgety/restless    2  Suicidal thoughts    0  PHQ-9 Score    18   Difficult doing work/chores    Somewhat difficult     Data saved with a previous flowsheet row definition        No data to display          Health Maintenance Due  Topic Date Due   HIV Screening  Never done   Hepatitis C Screening  Never done   Pneumococcal Vaccine: 50+ Years (1 of 2 - PCV) Never done   Hepatitis B Vaccines 19-59 Average Risk (1 of 3 - 19+ 3-dose series) Never done   Zoster Vaccines- Shingrix (1 of 2) Never done   Influenza Vaccine  Never done   COVID-19 Vaccine (3 - 2025-26 season) 08/29/2024     Dental: ***UTD Vision: ***UTD  PMH:  The following were reviewed and entered/updated in epic: Past Medical History:  Diagnosis Date   Anemia    High cholesterol    Hypertension    Tobacco abuse     Patient Active Problem List   Diagnosis Date Noted   Chronic low back pain with sciatica 10/20/2019   Routine general medical examination at a health care facility 11/02/2012   Other abnormal glucose 11/02/2012   Essential hypertension, benign 11/02/2012   Shifting sleep-work schedule, affecting sleep 04/16/2011   Hypertriglyceridemia 03/26/2011   Tobacco abuse 03/26/2011   Sleep apnea 03/12/2011  Past Surgical History:  Procedure Laterality Date   NO PAST SURGERIES      Family History  Problem Relation Age of Onset   Rheum arthritis Mother    Hypertension Mother    Colon polyps Father    Hyperlipidemia Father    Heart disease Father    Hypertension Father    Hypertension Sister    Cancer Maternal Aunt    Cancer Maternal Grandmother    Asthma Cousin    Hypertension Other    Colon cancer Neg Hx    Esophageal cancer Neg Hx    Rectal cancer Neg Hx    Stomach cancer Neg Hx     Medications- reviewed and updated Outpatient Medications Prior to Visit  Medication Sig Dispense Refill   amLODipine  (NORVASC ) 10 MG tablet Take 1 tablet (10 mg total) by mouth daily. 30 tablet 11    rosuvastatin  (CRESTOR ) 10 MG tablet Take 1 tablet (10 mg total) by mouth daily. 90 tablet 1   No facility-administered medications prior to visit.     Allergies  Allergen Reactions   Penicillins Other (See Comments)    Unknown_childhood reaction Has patient had a PCN reaction causing immediate rash, facial/tongue/throat swelling, SOB or lightheadedness with hypotension: unknown Has patient had a PCN reaction causing severe rash involving mucus membranes or skin necrosis: unknown Has patient had a PCN reaction that required hospitalization No Has patient had a PCN reaction occurring within the last 10 years: No If all of the above answers are NO, then may proceed with Cephalosporin use.     Social History   Socioeconomic History   Marital status: Single    Spouse name: Not on file   Number of children: 1   Years of education: Not on file   Highest education level: Not on file  Occupational History   Occupation: Therapist, Sports: [employer]  Tobacco Use   Smoking status: Every Day    Current packs/day: 0.25    Average packs/day: 0.3 packs/day for 20.0 years (5.0 ttl pk-yrs)    Types: Cigarettes   Smokeless tobacco: Never  Vaping Use   Vaping status: Never Used  Substance and Sexual Activity   Alcohol use: Yes    Alcohol/week: 10.0 standard drinks of alcohol    Types: 5 Cans of beer, 5 Shots of liquor per week    Comment: 2-3 beers a day   Drug use: No   Sexual activity: Yes  Other Topics Concern   Not on file  Social History Narrative   Regular Exercise -  YES         Social Drivers of Health   Financial Resource Strain: Not on file  Food Insecurity: Not on file  Transportation Needs: Not on file  Physical Activity: Not on file  Stress: Not on file  Social Connections: Not on file           Objective:  Physical Exam: There were no vitals taken for this visit.  There is no height or weight on file to calculate BMI. Wt Readings from  Last 3 Encounters:  07/14/24 201 lb (91.2 kg)  10/02/23 206 lb (93.4 kg)  09/24/23 209 lb 9.6 oz (95.1 kg)    Physical Exam          Physical Exam      Prior labs:   No results found for this or any previous visit (from the past 2160 hours).  Lab Results  Component Value Date  CHOL 194 08/30/2024   CHOL 226 (H) 07/14/2024   CHOL 158 01/17/2022   Lab Results  Component Value Date   HDL 93.30 08/30/2024   HDL 93.50 07/14/2024   HDL 71.80 01/17/2022   Lab Results  Component Value Date   LDLCALC 44 08/30/2024   LDLCALC 61 07/14/2024   Lab Results  Component Value Date   TRIG 286.0 (H) 08/30/2024   TRIG 355.0 (H) 07/14/2024   TRIG 202.0 (H) 01/17/2022   Lab Results  Component Value Date   CHOLHDL 2 08/30/2024   CHOLHDL 2 07/14/2024   CHOLHDL 2 01/17/2022   Lab Results  Component Value Date   LDLDIRECT 43.0 01/17/2022   LDLDIRECT 51.0 01/07/2019   LDLDIRECT 84.4 11/02/2012    Last metabolic panel Lab Results  Component Value Date   GLUCOSE 85 08/30/2024   NA 139 08/30/2024   K 4.4 08/30/2024   CL 100 08/30/2024   CO2 28 08/30/2024   BUN 12 08/30/2024   CREATININE 0.85 08/30/2024   GFR 98.69 08/30/2024   CALCIUM  9.6 08/30/2024   PROT 8.0 08/30/2024   ALBUMIN 4.6 08/30/2024   BILITOT 0.6 08/30/2024   ALKPHOS 72 08/30/2024   AST 44 (H) 08/30/2024   ALT 22 08/30/2024   ANIONGAP 11 11/04/2022    Lab Results  Component Value Date   HGBA1C 5.6 01/07/2019    Last CBC Lab Results  Component Value Date   WBC 10.5 09/24/2023   HGB 11.5 (L) 09/24/2023   HCT 35.6 (L) 09/24/2023   MCV 92.8 09/24/2023   MCH 29.1 11/04/2022   RDW 15.5 09/24/2023   PLT 303.0 09/24/2023    Lab Results  Component Value Date   TSH 0.35 02/10/2019    Lab Results  Component Value Date   PSA 1.19 01/17/2022   PSA 0.95 01/07/2019   PSA 1.26 11/02/2012    Last vitamin D No results found for: MARIEN BOLLS, VD25OH  Lab Results  Component Value  Date   BILIRUBINUR NEGATIVE 11/15/2018   PROTEINUR NEGATIVE 11/15/2018   UROBILINOGEN 1.0 04/12/2012   LEUKOCYTESUR TRACE (A) 11/15/2018    No results found for: LABMICR, MICROALBUR   At today's visit, we discussed treatment options, associated risk and benefits, and engage in counseling as needed.  Additionally the following were reviewed: Past medical records, past medical and surgical history, family and social background, as well as relevant laboratory results, imaging findings, and specialty notes, where applicable.  This message was generated using dictation software, and as a result, it may contain unintentional typos or errors.  Nevertheless, extensive effort was made to accurately convey at the pertinent aspects of the patient visit.    There may have been are other unrelated non-urgent complaints, but due to the busy schedule and the amount of time already spent with him, time does not permit to address these issues at today's visit. Another appointment may have or has been requested to review these additional issues.     Beverley KATHEE Hummer, MD  I,Emily Lagle,acting as a scribe for Beverley KATHEE Hummer, MD.,have documented all relevant documentation on the behalf of Beverley KATHEE Hummer, MD.  LILLETTE Beverley KATHEE Hummer, MD, have reviewed all documentation for this visit. The documentation on 11/30/2024 for the exam, diagnosis, procedures, and orders are all accurate and complete.

## 2024-12-20 ENCOUNTER — Encounter (HOSPITAL_COMMUNITY): Payer: Self-pay | Admitting: Emergency Medicine

## 2024-12-20 ENCOUNTER — Emergency Department (HOSPITAL_COMMUNITY)

## 2024-12-20 ENCOUNTER — Emergency Department (HOSPITAL_COMMUNITY)
Admission: EM | Admit: 2024-12-20 | Discharge: 2024-12-20 | Disposition: A | Discharge status: Home / Self Care | Attending: Emergency Medicine | Admitting: Emergency Medicine

## 2024-12-20 ENCOUNTER — Other Ambulatory Visit: Payer: Self-pay

## 2024-12-20 DIAGNOSIS — S20212A Contusion of left front wall of thorax, initial encounter: Secondary | ICD-10-CM | POA: Insufficient documentation

## 2024-12-20 DIAGNOSIS — S3991XA Unspecified injury of abdomen, initial encounter: Secondary | ICD-10-CM | POA: Diagnosis present

## 2024-12-20 DIAGNOSIS — S3011XA Contusion of abdominal wall, initial encounter: Secondary | ICD-10-CM | POA: Diagnosis not present

## 2024-12-20 DIAGNOSIS — Y92410 Unspecified street and highway as the place of occurrence of the external cause: Secondary | ICD-10-CM | POA: Diagnosis not present

## 2024-12-20 DIAGNOSIS — R930 Abnormal findings on diagnostic imaging of skull and head, not elsewhere classified: Secondary | ICD-10-CM | POA: Diagnosis not present

## 2024-12-20 DIAGNOSIS — Z79899 Other long term (current) drug therapy: Secondary | ICD-10-CM | POA: Insufficient documentation

## 2024-12-20 LAB — I-STAT CHEM 8, ED
BUN: 12 mg/dL (ref 6–20)
Calcium, Ion: 1.12 mmol/L — ABNORMAL LOW (ref 1.15–1.40)
Chloride: 99 mmol/L (ref 98–111)
Creatinine, Ser: 1.2 mg/dL (ref 0.61–1.24)
Glucose, Bld: 85 mg/dL (ref 70–99)
HCT: 41 % (ref 39.0–52.0)
Hemoglobin: 13.9 g/dL (ref 13.0–17.0)
Potassium: 3.6 mmol/L (ref 3.5–5.1)
Sodium: 139 mmol/L (ref 135–145)
TCO2: 26 mmol/L (ref 22–32)

## 2024-12-20 LAB — I-STAT CG4 LACTIC ACID, ED: Lactic Acid, Venous: 1.6 mmol/L (ref 0.5–1.9)

## 2024-12-20 LAB — CBC WITH DIFFERENTIAL/PLATELET
Abs Immature Granulocytes: 0.03 K/uL (ref 0.00–0.07)
Basophils Absolute: 0.1 K/uL (ref 0.0–0.1)
Basophils Relative: 1 %
Eosinophils Absolute: 0.2 K/uL (ref 0.0–0.5)
Eosinophils Relative: 2 %
HCT: 34.4 % — ABNORMAL LOW (ref 39.0–52.0)
Hemoglobin: 11.8 g/dL — ABNORMAL LOW (ref 13.0–17.0)
Immature Granulocytes: 0 %
Lymphocytes Relative: 40 %
Lymphs Abs: 4.8 K/uL — ABNORMAL HIGH (ref 0.7–4.0)
MCH: 30.6 pg (ref 26.0–34.0)
MCHC: 34.3 g/dL (ref 30.0–36.0)
MCV: 89.4 fL (ref 80.0–100.0)
Monocytes Absolute: 1.5 K/uL — ABNORMAL HIGH (ref 0.1–1.0)
Monocytes Relative: 12 %
Neutro Abs: 5.4 K/uL (ref 1.7–7.7)
Neutrophils Relative %: 45 %
Platelets: 264 K/uL (ref 150–400)
RBC: 3.85 MIL/uL — ABNORMAL LOW (ref 4.22–5.81)
RDW: 15.6 % — ABNORMAL HIGH (ref 11.5–15.5)
WBC: 12 K/uL — ABNORMAL HIGH (ref 4.0–10.5)
nRBC: 0.2 % (ref 0.0–0.2)

## 2024-12-20 LAB — COMPREHENSIVE METABOLIC PANEL WITH GFR
ALT: 30 U/L (ref 0–44)
AST: 53 U/L — ABNORMAL HIGH (ref 15–41)
Albumin: 4.5 g/dL (ref 3.5–5.0)
Alkaline Phosphatase: 84 U/L (ref 38–126)
Anion gap: 13 (ref 5–15)
BUN: 13 mg/dL (ref 6–20)
CO2: 25 mmol/L (ref 22–32)
Calcium: 9.6 mg/dL (ref 8.9–10.3)
Chloride: 100 mmol/L (ref 98–111)
Creatinine, Ser: 0.92 mg/dL (ref 0.61–1.24)
GFR, Estimated: >60 mL/min
Glucose, Bld: 85 mg/dL (ref 70–99)
Potassium: 3.5 mmol/L (ref 3.5–5.1)
Sodium: 138 mmol/L (ref 135–145)
Total Bilirubin: 0.8 mg/dL (ref 0.0–1.2)
Total Protein: 8.1 g/dL (ref 6.5–8.1)

## 2024-12-20 MED ORDER — IOHEXOL 300 MG/ML  SOLN
100.0000 mL | Freq: Once | INTRAMUSCULAR | Status: AC | PRN
  Administered 2024-12-20: 100 mL via INTRAVENOUS

## 2024-12-20 MED ORDER — IBUPROFEN 200 MG PO TABS
400.0000 mg | ORAL_TABLET | Freq: Once | ORAL | Status: AC
  Administered 2024-12-20: 400 mg via ORAL
  Filled 2024-12-20: qty 2

## 2024-12-20 MED ORDER — ACETAMINOPHEN 325 MG PO TABS
650.0000 mg | ORAL_TABLET | Freq: Once | ORAL | Status: AC
  Administered 2024-12-20: 650 mg via ORAL
  Filled 2024-12-20: qty 2

## 2024-12-20 MED ORDER — METHOCARBAMOL 750 MG PO TABS: 750.0000 mg | ORAL_TABLET | Freq: Three times a day (TID) | ORAL | 0 refills | Status: AC | PRN
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
## 2024-12-20 NOTE — Discharge Instructions (Signed)
 It was our pleasure to provide your ER care today - we hope that you feel better.  Take acetaminophen  or ibuprofen  as need for pain. You may also take robaxin  as need for muscle pain/spasm - no driving when taking.   Follow up with primary care doctor in 1-2 weeks if symptoms fail to improve/resolve.  Return to ER right away if worse, new symptoms, new/severe pain, trouble breathing, persistent vomiting, or other emergency concern.
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
## 2025-01-09 ENCOUNTER — Ambulatory Visit: Payer: Self-pay | Admitting: Medical

## 2025-01-09 ENCOUNTER — Ambulatory Visit (HOSPITAL_BASED_OUTPATIENT_CLINIC_OR_DEPARTMENT_OTHER)
Admission: RE | Admit: 2025-01-09 | Discharge: 2025-01-09 | Disposition: A | Discharge status: Home / Self Care | Source: Ambulatory Visit | Attending: Medical | Admitting: Medical

## 2025-01-09 ENCOUNTER — Ambulatory Visit: Admitting: Medical

## 2025-01-09 ENCOUNTER — Encounter: Payer: Self-pay | Admitting: Medical

## 2025-01-09 VITALS — BP 120/78 | HR 95 | Temp 98.1°F | Resp 15 | Ht 70.0 in | Wt 190.6 lb

## 2025-01-09 DIAGNOSIS — E785 Hyperlipidemia, unspecified: Secondary | ICD-10-CM | POA: Diagnosis not present

## 2025-01-09 DIAGNOSIS — R0789 Other chest pain: Secondary | ICD-10-CM

## 2025-01-09 DIAGNOSIS — I10 Essential (primary) hypertension: Secondary | ICD-10-CM

## 2025-01-09 DIAGNOSIS — Z720 Tobacco use: Secondary | ICD-10-CM

## 2025-01-09 MED ORDER — MELOXICAM 7.5 MG PO TABS: 7.5000 mg | ORAL_TABLET | Freq: Every day | ORAL | 0 refills | Status: AC
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
## 2025-01-10 NOTE — Progress Notes (Signed)
 Pt called but no answer. Detailed vm left and for him to call back with any questions

## 2025-07-18 ENCOUNTER — Encounter: Admitting: Family Medicine
